# Patient Record
Sex: Female | Born: 1975 | Race: Black or African American | Hispanic: No | Smoking: Former smoker
Health system: Southern US, Community
[De-identification: ages and names within clinical notes are randomized; demographics above are authoritative.]

## PROBLEM LIST (undated history)

## (undated) DIAGNOSIS — R42 Dizziness and giddiness: Secondary | ICD-10-CM

## (undated) HISTORY — DX: Dizziness and giddiness: R42

---

## 2000-11-01 ENCOUNTER — Inpatient Hospital Stay (HOSPITAL_COMMUNITY): Admission: AD | Admit: 2000-11-01 | Discharge: 2000-11-01 | Payer: Self-pay | Admitting: Obstetrics

## 2000-11-12 ENCOUNTER — Encounter: Payer: Self-pay | Admitting: Emergency Medicine

## 2000-11-12 ENCOUNTER — Emergency Department (HOSPITAL_COMMUNITY): Admission: EM | Admit: 2000-11-12 | Discharge: 2000-11-12 | Payer: Self-pay | Admitting: Emergency Medicine

## 2001-02-22 ENCOUNTER — Emergency Department (HOSPITAL_COMMUNITY): Admission: EM | Admit: 2001-02-22 | Discharge: 2001-02-22 | Payer: Self-pay | Admitting: Emergency Medicine

## 2001-05-29 ENCOUNTER — Emergency Department (HOSPITAL_COMMUNITY): Admission: EM | Admit: 2001-05-29 | Discharge: 2001-05-29 | Payer: Self-pay | Admitting: Emergency Medicine

## 2002-12-16 ENCOUNTER — Encounter: Payer: Self-pay | Admitting: Emergency Medicine

## 2002-12-16 ENCOUNTER — Emergency Department (HOSPITAL_COMMUNITY): Admission: EM | Admit: 2002-12-16 | Discharge: 2002-12-16 | Payer: Self-pay | Admitting: Emergency Medicine

## 2005-02-21 ENCOUNTER — Emergency Department (HOSPITAL_COMMUNITY): Admission: EM | Admit: 2005-02-21 | Discharge: 2005-02-21 | Payer: Self-pay | Admitting: Emergency Medicine

## 2006-05-13 ENCOUNTER — Emergency Department (HOSPITAL_COMMUNITY): Admission: EM | Admit: 2006-05-13 | Discharge: 2006-05-13 | Payer: Self-pay | Admitting: Emergency Medicine

## 2006-07-19 ENCOUNTER — Emergency Department (HOSPITAL_COMMUNITY): Admission: EM | Admit: 2006-07-19 | Discharge: 2006-07-19 | Payer: Self-pay | Admitting: Emergency Medicine

## 2006-09-06 ENCOUNTER — Ambulatory Visit (HOSPITAL_COMMUNITY): Admission: RE | Admit: 2006-09-06 | Discharge: 2006-09-06 | Payer: Self-pay | Admitting: Family Medicine

## 2006-11-03 ENCOUNTER — Ambulatory Visit (HOSPITAL_COMMUNITY): Admission: RE | Admit: 2006-11-03 | Discharge: 2006-11-03 | Payer: Self-pay | Admitting: Obstetrics & Gynecology

## 2007-01-16 ENCOUNTER — Ambulatory Visit (HOSPITAL_COMMUNITY): Admission: RE | Admit: 2007-01-16 | Discharge: 2007-01-16 | Payer: Self-pay | Admitting: Family Medicine

## 2007-03-21 ENCOUNTER — Ambulatory Visit: Payer: Self-pay | Admitting: Obstetrics and Gynecology

## 2007-03-21 ENCOUNTER — Encounter (INDEPENDENT_AMBULATORY_CARE_PROVIDER_SITE_OTHER): Payer: Self-pay | Admitting: Specialist

## 2007-03-21 ENCOUNTER — Inpatient Hospital Stay (HOSPITAL_COMMUNITY): Admission: AD | Admit: 2007-03-21 | Discharge: 2007-03-24 | Payer: Self-pay | Admitting: Family Medicine

## 2007-11-10 ENCOUNTER — Emergency Department (HOSPITAL_COMMUNITY): Admission: EM | Admit: 2007-11-10 | Discharge: 2007-11-10 | Payer: Self-pay | Admitting: Emergency Medicine

## 2008-05-13 ENCOUNTER — Emergency Department (HOSPITAL_COMMUNITY): Admission: EM | Admit: 2008-05-13 | Discharge: 2008-05-13 | Payer: Self-pay | Admitting: Emergency Medicine

## 2009-05-28 ENCOUNTER — Emergency Department (HOSPITAL_COMMUNITY): Admission: EM | Admit: 2009-05-28 | Discharge: 2009-05-28 | Payer: Self-pay | Admitting: Emergency Medicine

## 2011-04-04 LAB — POCT PREGNANCY, URINE

## 2011-05-13 NOTE — Discharge Summary (Signed)
Paula Ray, Paula Ray                  ACCOUNT NO.:  [id number]   MEDICAL RECORD NO.:  [id number]          PATIENT TYPE:  INP   LOCATION:  9101                          FACILITY:  [facility]   PHYSICIAN:  Tracy L. Mayford Knife, M.D.DATE OF BIRTH:  1976-08-27   DATE OF ADMISSION:  03/21/2007  DATE OF DISCHARGE:  03/24/2007                               DISCHARGE SUMMARY   ADMISSION DIAGNOSIS:  A 39-week 1-day intrauterine pregnancy with  spontaneous rupture of membranes.   DISCHARGE DIAGNOSES:  1. Status post low transverse Cesarean section for cephalopelvic      disproportion, secondary to persistent posterior arrest.  2. Chorioamnionitis.  3. Anemia of pregnancy, aggravated by acute blood loss.   PERTINENT LABORATORY DATA:  Admission  hemoglobin 12.7. Operative day  #1, hemoglobin 10.5. Postoperative day #2, hemoglobin 9.7.   HOSPITAL COURSE:  The patient is a 35 year old G1 at 39 weeks 1 day who  presented following spontaneous rupture of membranes. She was dilated 1  to 2 cm at that time. The patient was noted to have elevated blood  pressures. She was transferred to labor and delivery where she got her  epidural, and her blood pressures improved. The patient progressed to  complete with Pitocin augmentation. She pushed for approximately 2.5  hours, never progressed past +2 station. The baby was in the direct  occiput posterior presentation. She was taken for cesarean section.  Please see cesarean section for full details. Viable female infant was  born. It is noted in the operative note that the baby's head was direct  occiput posterior and V flexed, so an assistant had to get under and  help push the head up to the point where a vacuum could be placed and  get the baby out. Apparently, the amniotic fluid also smelled foul, so  the patient was continued on antibiotics for 24 hours post surgery. The  patient remained afebrile during her hospitalization.   Other issue during this pregnancy  was anemia. Hemoglobin on admission  was 12.7. On postoperative day #2, it was 9.7. The patient was stable  and has no side effects from her hemoglobin being lower.   DISPOSITION:  [facility] she  will decide what she wants for birth control. [facility] nurse will  discontinue staples on postoperative day 5 to 7.   DISCHARGE MEDICATIONS:  1. Percocet 5 one to two p.o. q.4-6h. p.r.n.  2. Ibuprofen 600 mg p.o. q.8h.  3. Prenatal vitamins daily.   ACTIVITY:  Nothing per vagina for 6 weeks. No heavy lifting.   DIET:  Regular.           ______________________________  Marc Morgans. Mayford Knife, M.D.     TLW/MEDQ  D:  03/24/2007  T:  03/24/2007  Job:  [id number]

## 2011-05-13 NOTE — Op Note (Signed)
NAME:  Paula Ray, Paula Ray                  ACCOUNT NO.:  [id number]   MEDICAL RECORD NO.:  [id number]          PATIENT TYPE:  INP   LOCATION:  9101                          FACILITY:  [facility]   PHYSICIAN:  Phil D. Okey Dupre, M.D.     DATE OF BIRTH:  1976/01/16   DATE OF PROCEDURE:  03/21/2007  DATE OF DISCHARGE:                               OPERATIVE REPORT   PROCEDURE:  A low transverse cesarean section.   PREOPERATIVE DIAGNOSIS:  Cephalopelvic disproportion, persistent  posterior arrest.   POSTOPERATIVE DIAGNOSIS:  Cephalopelvic disproportion, persistent  posterior arrest.   SURGEON:  Dr. Okey Dupre   FIRST ASSISTANT:  Was my nurse Chip Boer.   ANESTHESIA:  Was epidural.   ESTIMATED BLOOD LOSS:  700 mL   SPECIMENS:  Placenta to pathology.   POSTOPERATIVE CONDITION:  Satisfactory   DESCRIPTION OF PROCEDURE:  Under satisfactory epidural anesthesia the  patient in dorsosupine position, Foley catheter in urinary bladder,  abdomen was prepped and draped in the usual sterile manner.  The abdomen  was entered through a transverse suprapubic incision situated just in  the crease under the abdominal apron and entered by layers. On entering  the peritoneal cavity visceral peritoneum and the anterior surface of  uterus opened transversely by sharp dissection after the ring retractor  had been placed into the peritoneal cavity for exposure. Bladder was  pushed away from the lower uterine segment was entered by sharp and  blunt dissection.  It was at this point that I found the baby's head was  stuck in a direct OP presentation, and deflexed in the pelvis. A hand to  be pushed back into the uterine incision as I attempted to dislodge the  baby's head. After several minutes unable to move it one bit, I had the  assistant go down and push it up from below, and I was then able to flex  the head enough to get the vacuum on the occiput and deliver the baby at  that point quite easily.  The baby had a terrible  odor at that time. The  cord was doubly clamped, divided. The baby handed pediatrician.  Specimens were taken from the cord for analysis.  The placenta was  manually removed and the uterus explored. The uterus was then closed in  two layers of continuous running locked 0 Vicryl on an atraumatic  needle. The angles were specifically examined to make sure there was no  extension laterally and no tears because of the excessive manipulation  at that time that I had to get the baby delivered.  Once this was done  the pelvis was irrigated and the fascia was closed continuous running 0  Vicryl on an atraumatic needle. Subcutaneous bleeders were controlled  with hot cautery and each layer was irrigated on exit.  The  skin edges approximated with skin staples.  Dry sterile dressings  applied.  The patient was transferred to recovery room in satisfactory  condition with the urine in the bag having a quite a slight heme  coloration but in the tube it was clear.  ______________________________  Javier Glazier Okey Dupre, M.D.     PDR/MEDQ  D:  03/21/2007  T:  03/21/2007  Job:  [id number]

## 2011-05-26 ENCOUNTER — Emergency Department (HOSPITAL_COMMUNITY): Payer: Self-pay

## 2011-05-26 ENCOUNTER — Emergency Department (HOSPITAL_COMMUNITY)
Admission: EM | Admit: 2011-05-26 | Discharge: 2011-05-26 | Disposition: A | Discharge status: Home / Self Care | Payer: Self-pay | Attending: Emergency Medicine | Admitting: Emergency Medicine

## 2011-05-26 DIAGNOSIS — J069 Acute upper respiratory infection, unspecified: Secondary | ICD-10-CM | POA: Insufficient documentation

## 2011-05-26 DIAGNOSIS — R0602 Shortness of breath: Secondary | ICD-10-CM | POA: Insufficient documentation

## 2011-05-26 DIAGNOSIS — J309 Allergic rhinitis, unspecified: Secondary | ICD-10-CM | POA: Insufficient documentation

## 2011-05-26 DIAGNOSIS — R05 Cough: Secondary | ICD-10-CM | POA: Insufficient documentation

## 2011-05-26 DIAGNOSIS — F17200 Nicotine dependence, unspecified, uncomplicated: Secondary | ICD-10-CM | POA: Insufficient documentation

## 2011-05-26 DIAGNOSIS — R059 Cough, unspecified: Secondary | ICD-10-CM | POA: Insufficient documentation

## 2011-05-26 LAB — RAPID STREP SCREEN (MED CTR MEBANE ONLY): Streptococcus, Group A Screen (Direct): NEGATIVE

## 2011-06-24 ENCOUNTER — Encounter (INDEPENDENT_AMBULATORY_CARE_PROVIDER_SITE_OTHER): Payer: Self-pay | Admitting: Physician Assistant

## 2011-06-24 DIAGNOSIS — Z803 Family history of malignant neoplasm of breast: Secondary | ICD-10-CM

## 2011-08-16 ENCOUNTER — Emergency Department (HOSPITAL_COMMUNITY)
Admission: EM | Admit: 2011-08-16 | Discharge: 2011-08-16 | Disposition: A | Discharge status: Home / Self Care | Payer: Self-pay | Attending: Emergency Medicine | Admitting: Emergency Medicine

## 2011-08-16 ENCOUNTER — Emergency Department (HOSPITAL_COMMUNITY): Payer: Self-pay

## 2011-08-16 DIAGNOSIS — R42 Dizziness and giddiness: Secondary | ICD-10-CM | POA: Insufficient documentation

## 2011-08-16 DIAGNOSIS — R110 Nausea: Secondary | ICD-10-CM | POA: Insufficient documentation

## 2011-08-16 DIAGNOSIS — R0789 Other chest pain: Secondary | ICD-10-CM | POA: Insufficient documentation

## 2011-08-16 DIAGNOSIS — F17200 Nicotine dependence, unspecified, uncomplicated: Secondary | ICD-10-CM | POA: Insufficient documentation

## 2011-08-16 LAB — POCT I-STAT, CHEM 8
Calcium, Ion: 1.21 mmol/L (ref 1.12–1.32)
HCT: 38 % (ref 36.0–46.0)
Hemoglobin: 12.9 g/dL (ref 12.0–15.0)
Sodium: 139 mEq/L (ref 135–145)
TCO2: 25 mmol/L (ref 0–100)

## 2011-08-16 LAB — POCT I-STAT TROPONIN I: Troponin i, poc: 0 ng/mL (ref 0.00–0.08)

## 2012-03-06 ENCOUNTER — Emergency Department (HOSPITAL_COMMUNITY)
Admission: EM | Admit: 2012-03-06 | Discharge: 2012-03-06 | Disposition: A | Discharge status: Home / Self Care | Payer: Self-pay | Attending: Emergency Medicine | Admitting: Emergency Medicine

## 2012-03-06 ENCOUNTER — Encounter (HOSPITAL_COMMUNITY): Payer: Self-pay | Admitting: *Deleted

## 2012-03-06 DIAGNOSIS — K5900 Constipation, unspecified: Secondary | ICD-10-CM | POA: Insufficient documentation

## 2012-03-06 DIAGNOSIS — F17200 Nicotine dependence, unspecified, uncomplicated: Secondary | ICD-10-CM | POA: Insufficient documentation

## 2012-03-06 MED ORDER — POLYETHYLENE GLYCOL 3350 17 GM/SCOOP PO POWD: ORAL | Status: DC

## 2012-03-06 MED ORDER — MAGNESIUM CITRATE PO SOLN
1.0000 | Freq: Once | ORAL | Status: DC
  Filled 2012-03-06: qty 296

## 2012-03-06 NOTE — Discharge Instructions (Signed)
Take miralax as directed in the next 6 hours if you do not have relief of constipation. You may also consider over the counter enema but return to ER for changing or worsening of symptoms.   Constipation in Adults Constipation is having fewer than 2 bowel movements per week. Usually, the stools are hard. As we grow older, constipation is more common. If you try to fix constipation with laxatives, the problem may get worse. This is because laxatives taken over a long period of time make the colon muscles weaker. A low-fiber diet, not taking in enough fluids, and taking some medicines may make these problems worse. MEDICATIONS THAT MAY CAUSE CONSTIPATION  Water pills (diuretics).   Calcium channel blockers (used to control blood pressure and for the heart).   Certain pain medicines (narcotics).   Anticholinergics.   Anti-inflammatory agents.   Antacids that contain aluminum.  DISEASES THAT CONTRIBUTE TO CONSTIPATION  Diabetes.   Parkinson's disease.   Dementia.   Stroke.   Depression.   Illnesses that cause problems with salt and water metabolism.  HOME CARE INSTRUCTIONS   Constipation is usually best cared for without medicines. Increasing dietary fiber and eating more fruits and vegetables is the best way to manage constipation.   Slowly increase fiber intake to 25 to 38 grams per day. Whole grains, fruits, vegetables, and legumes are good sources of fiber. A dietitian can further help you incorporate high-fiber foods into your diet.   Drink enough water and fluids to keep your urine clear or pale yellow.   A fiber supplement may be added to your diet if you cannot get enough fiber from foods.   Increasing your activities also helps improve regularity.   Suppositories, as suggested by your caregiver, will also help. If you are using antacids, such as aluminum or calcium containing products, it will be helpful to switch to products containing magnesium if your caregiver says  it is okay.   If you have been given a liquid injection (enema) today, this is only a temporary measure. It should not be relied on for treatment of longstanding (chronic) constipation.   Stronger measures, such as magnesium sulfate, should be avoided if possible. This may cause uncontrollable diarrhea. Using magnesium sulfate may not allow you time to make it to the bathroom.  SEEK IMMEDIATE MEDICAL CARE IF:   There is bright red blood in the stool.   The constipation stays for more than 4 days.   There is belly (abdominal) or rectal pain.   You do not seem to be getting better.   You have any questions or concerns.  MAKE SURE YOU:   Understand these instructions.   Will watch your condition.   Will get help right away if you are not doing well or get worse.  Document Released: 09/09/2004 Document Revised: 12/01/2011 Document Reviewed: 11/15/2011 [facility] Patient Information 2012 [place], [place].

## 2012-03-06 NOTE — ED Notes (Signed)
Patient reports she had a stomach virus a couple of weekends ago,  Then noted onset of constipation last week.  Patient reports she has not been able to pass gas for one day.  Patient reports her last bm was 4 or more days ago

## 2012-03-06 NOTE — ED Provider Notes (Signed)
Medical screening examination/treatment/procedure(s) were performed by non-physician practitioner and as supervising physician I was immediately available for consultation/collaboration. Devoria Albe, MD, Armando Gang   Ward Givens, MD 03/06/12 [phone]

## 2012-03-06 NOTE — ED Provider Notes (Signed)
History     CSN: [id number]  Arrival date & time 03/06/12  0841   First MD Initiated Contact with Patient 03/06/12 [phone]      Chief Complaint  Patient presents with  . Constipation  . Abdominal Pain    (Consider location/radiation/quality/duration/timing/severity/associated sxs/prior treatment) HPI  Patient presents to the emergency department complaining of a four-day history of constipation. Patient states that approximately a week and a half ago she had a few day history of nausea vomiting and diarrhea for which she took Pepto-Bismol and had relief of symptoms with in 2 days. Patient states she  felt well for a few days to a week and went back to eating her normal diet. Patient states that 4 days ago she had a small hard bowel movement and since then has had no other bowel movements. Patient states she has pressure in her lower abdominal region stating "I have the sensation that I need to have a bowel movement but I just can't." She denies injury to her back, difficulty urinating or saddle seat paresthesias. Patient states that she took a cap full of MiraLAX last night as well as half a tablet of a laxative but did not have a bowel movement and when she woke this morning with ongoing sensation to have a bowel movement and pressure in lower abdomen she presented to the ER for further evaluation. Patient states she has history of cesarean section but denies any other abdominal surgeries. Patient denies any current nausea or vomiting or any vomiting over the last week. Patient states she's been eating well over the last 4 days despite having constipation. She denies any point specific abdominal pain rather describes abdominal pressure or fullness. She denies fevers, chills, chest pain, shortness of breath, dysuria, hematuria or blood in her stool. Patient states last night she got a pair of latex gloves and felt in her rectum was able to pull out "small hard stool" but states she did not have a free  moving bowel movement after that.  History reviewed. No pertinent past medical history.  Past Surgical History  Procedure Date  . Cesarean section     No family history on file.  History  Substance Use Topics  . Smoking status: Current Everyday Smoker  . Smokeless tobacco: Not on file  . Alcohol Use: Yes    OB History    Grav Para Term Preterm Abortions TAB SAB Ect Mult Living                  Review of Systems  All other systems reviewed and are negative.    Allergies  Allegra and Zyrtec  Home Medications   Current Outpatient Rx  Name Route Sig Dispense Refill  . DOCUSATE SODIUM 100 MG PO CAPS Oral Take 100 mg by mouth at bedtime as needed. For constipation    . IBUPROFEN 200 MG PO TABS Oral Take 400 mg by mouth every 6 (six) hours as needed. For pain    . ADULT MULTIVITAMIN W/MINERALS CH Oral Take 1 tablet by mouth daily.    Marland Kitchen POLYETHYLENE GLYCOL 3350 PO PACK Oral Take 17 g by mouth daily as needed. For constipation      BP 117/73  Pulse 80  Temp(Src) 98.6 F (37 C) (Oral)  Resp 20  Ht 5\' 3"  (1.6 m)  Wt 240 lb (108.863 kg)  BMI 42.51 kg/m2  SpO2 98%  Physical Exam  Nursing note and vitals reviewed. Constitutional: She is oriented to person, place,  and time. She appears well-developed and well-nourished. No distress.  HENT:  Head: Normocephalic and atraumatic.  Eyes: Conjunctivae are normal.  Neck: Normal range of motion. Neck supple.  Cardiovascular: Normal rate, regular rhythm, normal heart sounds and intact distal pulses.  Exam reveals no gallop and no friction rub.   No murmur heard. Pulmonary/Chest: Effort normal and breath sounds normal. No respiratory distress. She has no wheezes. She has no rales. She exhibits no tenderness.  Abdominal: Soft. Bowel sounds are normal. She exhibits no distension and no mass. There is no tenderness. There is no rebound and no guarding.  Genitourinary:       Soft brown stool deep in vault without impaction. Normal  rectal tone.   Musculoskeletal: Normal range of motion. She exhibits no edema and no tenderness.  Neurological: She is alert and oriented to person, place, and time.  Skin: Skin is warm and dry. No rash noted. She is not diaphoretic. No erythema.  Psychiatric: She has a normal mood and affect.    ED Course  Procedures (including critical care time)  PO magnesium citrate  Labs Reviewed - No data to display No results found.   1. Constipation       MDM  Patient is afebrile with abdomen soft and non tender. No signs or symptoms of SBO with patient denying vomiting and no impaction on rectal exam. Spoke at length with patient about at home treatment of constipation and precautions that would warrant return to ER. Patient voices understanding and is agreeable to plan.         [place], [place] 03/06/12 1304

## 2012-04-10 ENCOUNTER — Encounter (HOSPITAL_COMMUNITY): Payer: Self-pay | Admitting: Emergency Medicine

## 2012-04-10 ENCOUNTER — Emergency Department (HOSPITAL_COMMUNITY)
Admission: EM | Admit: 2012-04-10 | Discharge: 2012-04-10 | Disposition: A | Discharge status: Home / Self Care | Payer: Self-pay | Attending: Emergency Medicine | Admitting: Emergency Medicine

## 2012-04-10 DIAGNOSIS — R509 Fever, unspecified: Secondary | ICD-10-CM | POA: Insufficient documentation

## 2012-04-10 DIAGNOSIS — R062 Wheezing: Secondary | ICD-10-CM | POA: Insufficient documentation

## 2012-04-10 DIAGNOSIS — J40 Bronchitis, not specified as acute or chronic: Secondary | ICD-10-CM | POA: Insufficient documentation

## 2012-04-10 MED ORDER — ALBUTEROL SULFATE (5 MG/ML) 0.5% IN NEBU
5.0000 mg | INHALATION_SOLUTION | Freq: Once | RESPIRATORY_TRACT | Status: AC
  Administered 2012-04-10: 5 mg via RESPIRATORY_TRACT
  Filled 2012-04-10: qty 0.5

## 2012-04-10 MED ORDER — ALBUTEROL SULFATE HFA 108 (90 BASE) MCG/ACT IN AERS
2.0000 | INHALATION_SPRAY | Freq: Four times a day (QID) | RESPIRATORY_TRACT | Status: DC
  Administered 2012-04-10: 2 via RESPIRATORY_TRACT
  Filled 2012-04-10: qty 6.7

## 2012-04-10 MED ORDER — PREDNISONE 10 MG PO TABS: 50.0000 mg | ORAL_TABLET | Freq: Every day | ORAL | Status: DC

## 2012-04-10 MED ORDER — IPRATROPIUM BROMIDE 0.02 % IN SOLN
0.5000 mg | RESPIRATORY_TRACT | Status: AC
  Administered 2012-04-10: 0.5 mg via RESPIRATORY_TRACT
  Filled 2012-04-10: qty 2.5

## 2012-04-10 MED ORDER — IBUPROFEN 800 MG PO TABS: 800.0000 mg | ORAL_TABLET | Freq: Three times a day (TID) | ORAL | Status: AC

## 2012-04-10 NOTE — ED Notes (Signed)
Pt c/o URI sx and fever starting yesterday with productive cough and body aches

## 2012-04-10 NOTE — ED Notes (Signed)
Pt denies any pain or questions upon discharge. 

## 2012-04-10 NOTE — ED Provider Notes (Signed)
History     CSN: [id number]  Arrival date & time 04/10/12  1713   First MD Initiated Contact with Patient 04/10/12 1902      Chief Complaint  Patient presents with  . URI  . Fever    (Consider location/radiation/quality/duration/timing/severity/associated sxs/prior treatment) HPI History from patient. 36 year old female with no pertinent past medical history who presents with URI symptoms which have been ongoing since yesterday. She states that she has had a cough which has been productive of yellow sputum. Denies any associated hemoptysis, leg swelling. Has had associated congestion, sinus pressure, and headache. She denies any dizziness, weakness, nausea, vomiting. She has had no chest pain, shortness of breath, diaphoresis. Denies abdominal pain, diarrhea. No treatment prior to arrival. Symptoms are constant in nature.  History reviewed. No pertinent past medical history.  Past Surgical History  Procedure Date  . Cesarean section     History reviewed. No pertinent family history.  History  Substance Use Topics  . Smoking status: Current Everyday Smoker  . Smokeless tobacco: Not on file  . Alcohol Use: Yes    OB History    Grav Para Term Preterm Abortions TAB SAB Ect Mult Living                  Review of Systems  Constitutional: Negative for fever, chills, activity change and appetite change.  HENT: Negative for neck pain and neck stiffness.   Cardiovascular: Negative for chest pain and palpitations.  Gastrointestinal: Negative for nausea, vomiting, abdominal pain and abdominal distention.    Allergies  Allegra and Zyrtec  Home Medications   Current Outpatient Rx  Name Route Sig Dispense Refill  . DOCUSATE SODIUM 100 MG PO CAPS Oral Take 100 mg by mouth at bedtime as needed. For constipation      BP 127/66  Pulse 96  Temp(Src) 98.8 F (37.1 C) (Oral)  Resp 20  SpO2 98%  Physical Exam  Nursing note and vitals reviewed. Constitutional: She appears  well-developed and well-nourished. No distress.  HENT:  Head: Normocephalic and atraumatic.  Right Ear: External ear normal.  Left Ear: External ear normal.  Mouth/Throat: Oropharynx is clear and moist. No oropharyngeal exudate.       Tympanic membranes normal bilaterally. No sinus tenderness to palpation  Eyes: EOM are normal. Pupils are equal, round, and reactive to light.  Neck: Normal range of motion.  Cardiovascular: Normal rate, regular rhythm and normal heart sounds.   Pulmonary/Chest: Effort normal. She exhibits no tenderness.       Expiratory wheezing throughout bilateral lung fields  Abdominal: Soft. There is no tenderness.  Musculoskeletal: Normal range of motion. She exhibits no edema.  Neurological: She is alert.  Skin: Skin is warm and dry. No rash noted. She is not diaphoretic.  Psychiatric: She has a normal mood and affect.    ED Course  Procedures (including critical care time)  Labs Reviewed - No data to display No results found.   1. Bronchitis       MDM  Patient presents with URI symptoms since yesterday. Vital signs stable and she is nontoxic appearing. Based on presentation, this appears to be infectious in nature. She was given an albuterol/Atrovent inhaler here, which vastly improved breath sounds. She obtained significant symptomatic relief with this. We'll plan to treat as likely bronchitis, with prednisone and albuterol. Return cost was discussed.        Grant Fontana, [place] 04/11/12 [phone]

## 2012-04-10 NOTE — Discharge Instructions (Signed)
Your wheezing indicates that you likely have bronchitis, which is an inflammation of the bronchioles in your lungs. You have been given a prescription for 3 days of prednisone and an albuterol inhaler to treat this. Use the albuterol every 4 hours while awake for the next 2 days then every 4 hours as needed. You may take Mucinex D to treat your congestion, and try nasal saline rinses for congestion as well. You can take 800 mg of ibuprofen 3 times a day as needed for muscle aches (take with food). Return to the ER with trouble breathing, chest pain, high fever not controlled by medication, or otherwise worsening condition.  RESOURCE GUIDE  Dental Problems  Patients with Medicaid: [facility] [phone] [address]                                           [phone] [address] Phone:  [phone]                                                  Phone:  [phone]  If unable to pay or uninsured, contact:  [facility] or [facility]. to become qualified for the adult dental clinic.  Chronic Pain Problems Contact [facility]  [phone] Patients need to be referred by their primary care doctor.  Insufficient Money for Medicine Contact [employer]:  call "211" or [facility] 601-125-6533.  No Primary Care Doctor Call [facility]  [phone] Other agencies that provide inexpensive medical care    [facility]  [phone]    [facility]  [phone]    [facility]  [phone]    [facility]  [phone]    [facility]  [phone]    [facility]  [phone]  Psychological Services [facility]  [phone] [facility]  [phone] [facility]   [phone] (emergency services [phone])  Substance Abuse Resources [facility]  [phone] [facility] [phone] [facility]  2140424024 Floydene Flock [phone] Residential & Outpatient Substance Abuse Program  [phone]  Abuse/Neglect [facility] Child Abuse Hotline (216)863-5991 [facility] Child Abuse Hotline 352-378-3640 (After Hours)  Emergency Shelter [facility] [phone]  M[facility] [place] [facility] [phone] [facility] [phone]  MRSA Hotline #:   (470)096-2873    [facility] Resources  [facility]     [employer]                          [facility] Dept. [address] [place]                       [address]      [address]  [address]  Cristobal Goldmann Phone:  [phone]                                   Phone:  [phone]                 Phone:  [phone]  [facility] Phone:  [phone]  [facility] Child Abuse Hotline 812-119-6896 873-338-9400 (After Hours)  Antibiotic Nonuse  Your caregiver felt that the infection or problem was not one that would be helped with an antibiotic. Infections may be caused by viruses or bacteria. Only a caregiver can tell which one of these is the likely cause of an illness. A cold is the most common cause of infection in both adults and children. A cold is a virus. Antibiotic treatment will have no effect on a viral infection. Viruses can lead to many lost days of work caring for sick children and many missed days of school. Children may catch as many as 10 "colds" or "flus" per year during which they can be tearful, cranky, and uncomfortable. The goal of treating a virus is aimed at keeping the ill person comfortable. Antibiotics are medications used to help the body fight bacterial infections. There are relatively few types of bacteria that cause infections but there are hundreds of viruses. While both viruses and bacteria cause  infection they are very different types of germs. A viral infection will typically go away by itself within 7 to 10 days. Bacterial infections may spread or get worse without antibiotic treatment. Examples of bacterial infections are:  Sore throats (like strep throat or tonsillitis).   Infection in the lung (pneumonia).   Ear and skin infections.  Examples of viral infections are:  Colds or flus.   Most coughs and bronchitis.   Sore throats not caused by Strep.   Runny noses.  It is often best not to take an antibiotic when a viral infection is the cause of the problem. Antibiotics can kill off the helpful bacteria that we have inside our body and allow harmful bacteria to start growing. Antibiotics can cause side effects such as allergies, nausea, and diarrhea without helping to improve the symptoms of the viral infection. Additionally, repeated uses of antibiotics can cause bacteria inside of our body to become resistant. That resistance can be passed onto harmful bacterial. The next time you have an infection it may be harder to treat if antibiotics are used when they are not needed. Not treating with antibiotics allows our own immune system to develop and take care of infections more efficiently. Also, antibiotics will work better for [place] when they are prescribed for bacterial infections. Treatments for a child that is ill may include:  Give extra fluids throughout the day to stay hydrated.   Get plenty of rest.   Only give your child over-the-counter or prescription medicines for pain, discomfort, or fever as directed by your caregiver.   The use of a cool mist humidifier may help stuffy noses.   Cold medications if suggested by your caregiver.  Your caregiver may decide to start you on an antibiotic if:  The problem you were seen for today continues for a longer length of time than expected.   You develop a secondary bacterial infection.  SEEK MEDICAL CARE IF:  Fever lasts  longer than 5 days.   Symptoms continue to get worse after 5 to 7 days or become severe.   Difficulty in breathing develops.   Signs of dehydration develop (poor drinking, rare urinating, dark colored urine).   Changes in behavior or worsening tiredness (listlessness or lethargy).  Document Released: 02/20/2002 Document Revised: 12/01/2011 Document Reviewed: 08/19/2009 [facility] Patient Information 2012 [place], [place].Bronchitis Bronchitis is the body's way of reacting to injury and/or infection (inflammation) of the bronchi. Bronchi are the air tubes that extend from the windpipe into the lungs. If the inflammation becomes severe, it may cause shortness of breath. CAUSES  Inflammation may be caused by:  A virus.   Germs (bacteria).   Dust.   Allergens.   Pollutants and many other irritants.  The cells lining the bronchial tree are covered with tiny hairs (cilia). These constantly beat upward, away from the lungs, toward the mouth. This keeps the lungs free of pollutants. When these cells become too irritated and are unable to do their job, mucus begins to develop. This causes the characteristic cough of bronchitis. The cough clears the lungs when the cilia are unable to do their job. Without either of these protective mechanisms, the mucus would settle in the lungs. Then you would develop pneumonia. Smoking is a common cause of bronchitis and can contribute to pneumonia. Stopping this habit is the single most important thing you can do to help yourself. TREATMENT   Your caregiver may prescribe an antibiotic if the cough is caused by bacteria. Also, medicines that open up your airways make it easier to breathe. Your caregiver may also recommend or prescribe an expectorant. It will loosen the mucus to be coughed up. Only take over-the-counter or prescription medicines for pain, discomfort, or fever as directed by your caregiver.   Removing whatever causes the problem (smoking, for  example) is critical to preventing the problem from getting worse.   Cough suppressants may be prescribed for relief of cough symptoms.   Inhaled medicines may be prescribed to help with symptoms now and to help prevent problems from returning.   For those with recurrent (chronic) bronchitis, there may be a need for steroid medicines.  SEEK IMMEDIATE MEDICAL CARE IF:   During treatment, you develop more pus-like mucus (purulent sputum).   You have a fever.   Your baby is older than 3 months with a rectal temperature of 102 F (38.9 C) or higher.   Your baby is 3 months old or younger with a rectal temperature of 100.4 F (38 C) or higher.   You become progressively more ill.   You have increased difficulty breathing, wheezing, or shortness of breath.  It is necessary to seek immediate medical care if you are elderly or sick from any other disease. MAKE SURE YOU:   Understand these instructions.   Will watch your condition.   Will get help right away if you are not doing well or get worse.  Document Released: 12/12/2005 Document Revised: 12/01/2011 Document Reviewed: 10/21/2008 [facility] Patient Information 2012 [place], [place].

## 2012-04-10 NOTE — ED Notes (Signed)
Respiratory called for breathing treatment.

## 2012-04-11 NOTE — ED Provider Notes (Signed)
Medical screening examination/treatment/procedure(s) were performed by non-physician practitioner and as supervising physician I was immediately available for consultation/collaboration.  Aston Lawhorn, MD 04/11/12 1651 

## 2012-10-10 ENCOUNTER — Encounter (HOSPITAL_COMMUNITY): Payer: Self-pay | Admitting: Family Medicine

## 2012-10-10 ENCOUNTER — Emergency Department (HOSPITAL_COMMUNITY)
Admission: EM | Admit: 2012-10-10 | Discharge: 2012-10-10 | Disposition: A | Discharge status: Home / Self Care | Payer: Self-pay | Attending: Emergency Medicine | Admitting: Emergency Medicine

## 2012-10-10 ENCOUNTER — Emergency Department (HOSPITAL_COMMUNITY): Payer: Self-pay

## 2012-10-10 DIAGNOSIS — M79609 Pain in unspecified limb: Secondary | ICD-10-CM

## 2012-10-10 DIAGNOSIS — M25469 Effusion, unspecified knee: Secondary | ICD-10-CM | POA: Insufficient documentation

## 2012-10-10 DIAGNOSIS — M25569 Pain in unspecified knee: Secondary | ICD-10-CM | POA: Insufficient documentation

## 2012-10-10 MED ORDER — OXYCODONE-ACETAMINOPHEN 5-325 MG PO TABS: ORAL_TABLET | ORAL | Status: DC

## 2012-10-10 NOTE — ED Provider Notes (Signed)
History     CSN: [id number]  Arrival date & time 10/10/12  1191   First MD Initiated Contact with Patient 10/10/12 1020      Chief Complaint  Patient presents with  . Knee Pain    (Consider location/radiation/quality/duration/timing/severity/associated sxs/prior treatment) The history is provided by the patient and medical records.    Paula Ray is a 36 y.o. female presents to the emergency department complaining of L knee swelling.  The onset of the symptoms was  gradual starting 1 day ago.  The patient has associated pain, paresthesias of the L foot, calf pain and swelling.  The symptoms have been  persistent, gradually worsened.  nothing makes the symptoms worse and nothing makes symptoms better.  The patient denies fall, known injury.  Pt states yesterday morning she began to develop swelling and pain in her L knee.  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  Pt denies Hx of DVT, long trips, no exogenous estrogen, or recent surgery. Pt states she is ambulatory with mild difficulty.     History reviewed. No pertinent past medical history.  Past Surgical History  Procedure Date  . Cesarean section     History reviewed. No pertinent family history.  History  Substance Use Topics  . Smoking status: Current Every Day Smoker  . Smokeless tobacco: Not on file  . Alcohol Use: Yes    OB History    Grav Para Term Preterm Abortions TAB SAB Ect Mult Living                  Review of Systems  Musculoskeletal: Positive for joint swelling, arthralgias and gait problem.       Calf pain  Skin: Negative for wound.  Neurological: Negative for numbness.    Allergies  Fexofenadine hcl and Zyrtec  Home Medications   Current Outpatient Rx  Name Route Sig Dispense Refill  . BENGAY EX Apply externally Apply 1 application topically daily as needed. For knee pain      BP 120/62  Pulse 85  Temp 98 F (36.7 C)  Resp 18   SpO2 98%  LMP 09/10/2012  Physical Exam  Nursing note and vitals reviewed. Constitutional: She appears well-developed and well-nourished. No distress.  HENT:  Head: Normocephalic and atraumatic.  Eyes: Conjunctivae normal are normal.  Cardiovascular: Normal rate, regular rhythm, normal heart sounds and intact distal pulses.  Exam reveals no gallop and no friction rub.   No murmur heard.      Capillary refill < 3 seconds  Pulmonary/Chest: Effort normal and breath sounds normal. No respiratory distress. She has no wheezes. She exhibits no tenderness.  Musculoskeletal: She exhibits tenderness ( L knee). She exhibits no edema.       ROM: Full ROM with pain; knee swelling and effusion present No increased warmth or erythema Mild calf swelling present; no pitting edema No significant calf tenderness; no palpable cord; negative homan's sign  Neurological: She is alert. Coordination normal.       Sensation intact to normal touch bilaterally Strength 5/5 in lower extremities bilaterally  Skin: Skin is warm and dry. No rash noted. She is not diaphoretic. No erythema.  Psychiatric: She has a normal mood and affect.    ED Course  Procedures (including critical care time)  Labs Reviewed - No data to display Dg Knee Complete 4 Views Left  10/10/2012  *RADIOLOGY REPORT*  Clinical Data: Knee pain  LEFT KNEE - COMPLETE 4+ VIEW  Comparison: None.  Findings: Negative for fracture.  Normal alignment and no degenerative changes or effusion.  No focal bony lesion.  IMPRESSION: Negative   Original Report Authenticated By: Camelia Phenes, M.D.      1. Knee pain   2. Knee effusion       MDM  Paula Ray presents with knee pain, knee swelling and L leg swelling.  Concern for DVT vs chondromalacia with knee effusion.  Pt with low risk Wells Criteria for DVT and PE.  PERC negative.  X-ray of L knee without bony abnormality.  Will evaluate for DVT with a LE Duplex scan.  I have discussed the patient  with Wynetta Emery PA-C and the patient will be moved to the CDU.    Plan: if LE Duplex negative pt may be d/c home with knee brace and Ortho follow-up.  I have discussed this with the patient and she is in agreement.          Dahlia Client Lakeishia Truluck, PA-C 10/10/12 1239

## 2012-10-10 NOTE — ED Provider Notes (Signed)
Paula Ray is a 36 y.o. female in CDU from pod A pending venous duplex of the lower extremities to rule out DVT. complaining left knee swelling and paresthesia with tenderness to palpation of left calf.   Patient resting comfortably in no acute distress or pain. Left knee shows trace swelling and tender to palpation of left calf with no size asymmetry bilaterally, no superficial collateral veins. Patient denies any shortness of breath and lung sounds are clear to auscultation bilaterally. Heart sounds are normal. Abdominal exam is benign.   Lotion may venous Doppler shows no DVT SVT muscle tears. Patient will be discharged with or so followup and pain control given.    Pt verbalized understanding and agrees with care plan. Outpatient follow-up and return precautions given.    New Prescriptions   OXYCODONE-ACETAMINOPHEN (PERCOCET/ROXICET) 5-325 MG PER TABLET    1 to 2 tabs PO q6hrs  PRN for pain    Wynetta Emery, PA-C 10/10/12 1424

## 2012-10-10 NOTE — ED Notes (Signed)
Pt c/o 6/10 pain at left knee since yesterday. Numbness sensation shoots down her left leg to left foot. Pt ambulatory, A&Ox4.

## 2012-10-10 NOTE — Progress Notes (Signed)
VASCULAR LAB PRELIMINARY  PRELIMINARY  PRELIMINARY  PRELIMINARY  Left lower extremity venous duplex completed.    Preliminary report: Left leg is negative for deep and superficial vein thrombosis.  No evidence of Baker's cyst or muscle tear. Ollie Esty, 10/10/2012, 12:06 PM

## 2012-10-10 NOTE — ED Provider Notes (Signed)
Medical screening examination/treatment/procedure(s) were performed by non-physician practitioner and as supervising physician I was immediately available for consultation/collaboration.  Ethelda Chick, MD 10/10/12 [phone]

## 2012-10-10 NOTE — ED Notes (Signed)
Pt sts left knee pain and swelling since yesterday. sts numbness in leg and foot.

## 2012-10-10 NOTE — ED Provider Notes (Signed)
Medical screening examination/treatment/procedure(s) were performed by non-physician practitioner and as supervising physician I was immediately available for consultation/collaboration.  Ethelda Chick, MD 10/10/12 [phone]

## 2014-11-26 ENCOUNTER — Emergency Department (HOSPITAL_COMMUNITY)
Admission: EM | Admit: 2014-11-26 | Discharge: 2014-11-26 | Disposition: A | Discharge status: Home / Self Care | Payer: Self-pay | Attending: Emergency Medicine | Admitting: Emergency Medicine

## 2014-11-26 ENCOUNTER — Encounter (HOSPITAL_COMMUNITY): Payer: Self-pay | Admitting: Emergency Medicine

## 2014-11-26 ENCOUNTER — Emergency Department (HOSPITAL_COMMUNITY): Payer: Self-pay

## 2014-11-26 DIAGNOSIS — R102 Pelvic and perineal pain: Secondary | ICD-10-CM | POA: Insufficient documentation

## 2014-11-26 DIAGNOSIS — Z79899 Other long term (current) drug therapy: Secondary | ICD-10-CM | POA: Insufficient documentation

## 2014-11-26 DIAGNOSIS — Z9889 Other specified postprocedural states: Secondary | ICD-10-CM | POA: Insufficient documentation

## 2014-11-26 DIAGNOSIS — Z720 Tobacco use: Secondary | ICD-10-CM | POA: Insufficient documentation

## 2014-11-26 DIAGNOSIS — N898 Other specified noninflammatory disorders of vagina: Secondary | ICD-10-CM | POA: Insufficient documentation

## 2014-11-26 DIAGNOSIS — Z3202 Encounter for pregnancy test, result negative: Secondary | ICD-10-CM | POA: Insufficient documentation

## 2014-11-26 LAB — CBC WITH DIFFERENTIAL/PLATELET
BASOS ABS: 0 10*3/uL (ref 0.0–0.1)
BASOS PCT: 0 % (ref 0–1)
EOS PCT: 2 % (ref 0–5)
Eosinophils Absolute: 0.1 10*3/uL (ref 0.0–0.7)
HCT: 37.8 % (ref 36.0–46.0)
Hemoglobin: 12.4 g/dL (ref 12.0–15.0)
LYMPHS PCT: 22 % (ref 12–46)
Lymphs Abs: 1.2 10*3/uL (ref 0.7–4.0)
MCH: 29.5 pg (ref 26.0–34.0)
MCHC: 32.8 g/dL (ref 30.0–36.0)
MCV: 90 fL (ref 78.0–100.0)
Monocytes Absolute: 0.3 10*3/uL (ref 0.1–1.0)
Monocytes Relative: 5 % (ref 3–12)
Neutro Abs: 3.9 10*3/uL (ref 1.7–7.7)
Neutrophils Relative %: 71 % (ref 43–77)
PLATELETS: 306 10*3/uL (ref 150–400)
RBC: 4.2 MIL/uL (ref 3.87–5.11)
RDW: 12.4 % (ref 11.5–15.5)
WBC: 5.6 10*3/uL (ref 4.0–10.5)

## 2014-11-26 LAB — URINALYSIS, ROUTINE W REFLEX MICROSCOPIC
BILIRUBIN URINE: NEGATIVE
Glucose, UA: NEGATIVE mg/dL
Hgb urine dipstick: NEGATIVE
Ketones, ur: NEGATIVE mg/dL
Leukocytes, UA: NEGATIVE
NITRITE: NEGATIVE
Protein, ur: NEGATIVE mg/dL
SPECIFIC GRAVITY, URINE: 1.026 (ref 1.005–1.030)
UROBILINOGEN UA: 1 mg/dL (ref 0.0–1.0)
pH: 5 (ref 5.0–8.0)

## 2014-11-26 LAB — WET PREP, GENITAL
Clue Cells Wet Prep HPF POC: NONE SEEN
Trich, Wet Prep: NONE SEEN
Yeast Wet Prep HPF POC: NONE SEEN

## 2014-11-26 LAB — RPR

## 2014-11-26 LAB — BASIC METABOLIC PANEL
ANION GAP: 13 (ref 5–15)
BUN: 9 mg/dL (ref 6–23)
CALCIUM: 9.5 mg/dL (ref 8.4–10.5)
CO2: 24 mEq/L (ref 19–32)
CREATININE: 0.86 mg/dL (ref 0.50–1.10)
Chloride: 103 mEq/L (ref 96–112)
GFR, EST NON AFRICAN AMERICAN: 86 mL/min — AB (ref >90)
Glucose, Bld: 91 mg/dL (ref 70–99)
Potassium: 4.1 mEq/L (ref 3.7–5.3)
SODIUM: 140 meq/L (ref 137–147)

## 2014-11-26 LAB — RAPID HIV SCREEN (WH-MAU): SUDS RAPID HIV SCREEN: NONREACTIVE

## 2014-11-26 LAB — POC URINE PREG, ED: PREG TEST UR: NEGATIVE

## 2014-11-26 MED ORDER — TRAMADOL HCL 50 MG PO TABS: 50.0000 mg | ORAL_TABLET | Freq: Four times a day (QID) | ORAL | Status: DC | PRN

## 2014-11-26 MED ORDER — IBUPROFEN 800 MG PO TABS
800.0000 mg | ORAL_TABLET | Freq: Once | ORAL | Status: AC
  Administered 2014-11-26: 800 mg via ORAL
  Filled 2014-11-26: qty 1

## 2014-11-26 NOTE — ED Notes (Signed)
Pt states that she is having pelvic pain x 1 day that "feels like menstrual cramps but it's not time for it yet".  Denies any discharge or bleeding but states that she has been urinating more frequently.

## 2014-11-26 NOTE — ED Notes (Signed)
Patient transported to Ultrasound 

## 2014-11-26 NOTE — ED Notes (Signed)
Per Marchelle FolksAmanda in lab, HIV results are NON-REACTIVE Will make assigned nurse aware

## 2014-11-26 NOTE — Discharge Instructions (Signed)
Take the prescribed medication as directed.  May take tylenol with this if needed. [facility] you have ongoing symptoms.  Would recommend an updated PAP smear. Return to the ED for new or worsening symptoms.

## 2014-11-26 NOTE — ED Provider Notes (Signed)
CSN: [id number]     Arrival date & time 11/26/14  [id number] History   First MD Initiated Contact with Patient 11/26/14 0915     Chief Complaint  Patient presents with  . Pelvic Pain     (Consider location/radiation/quality/duration/timing/severity/associated sxs/prior Treatment) Patient is a 38 y.o. female presenting with pelvic pain. The history is provided by the patient and medical records.  Pelvic Pain  This is a 38 year old female with no significant past medical history presenting to the ED for 1 day of lower pelvic pain. Patient states pain came on abruptly, localized to her suprapubic region, and feels like menstrual cramps. She states she recently had her menstrual cycle on 11/14/2014 which was a normal cycle for her. She denies any irregular bleeding or vaginal discharge. She does know she has been urinating more frequently but denies dysuria or hematuria. No new sexual partners. Patient does have history of chlamydia in 2004, no issues since this time.  Has been monogamous with current sexual partners for the past several months. She has not seen an OB/GYN in several years since she lost her insurance. She denies any abdominal pain, nausea, vomiting, diarrhea, fever, chills, or sweats.   History reviewed. No pertinent past medical history. Past Surgical History  Procedure Laterality Date  . Cesarean section     No family history on file. History  Substance Use Topics  . Smoking status: Current Every Day Smoker  . Smokeless tobacco: Not on file  . Alcohol Use: Yes   OB History    No data available     Review of Systems  Genitourinary: Positive for frequency and pelvic pain.  All other systems reviewed and are negative.     Allergies  Fexofenadine hcl and Zyrtec  Home Medications   Prior to Admission medications   Medication Sig Start Date End Date Taking? Authorizing Provider  Menthol, Topical Analgesic, (BENGAY EX) Apply 1 application topically daily as needed. For  knee pain    Historical Provider, MD  oxyCODONE-acetaminophen (PERCOCET/ROXICET) 5-325 MG per tablet 1 to 2 tabs PO q6hrs  PRN for pain 10/10/12   Joni ReiningNicole Pisciotta, PA-C   BP 128/86 mmHg  Pulse 77  Temp(Src) 98.2 F (36.8 C) (Oral)  Resp 16  SpO2 100%  LMP 11/14/2014 (Approximate)   Physical Exam  Constitutional: She is oriented to person, place, and time. She appears well-developed and well-nourished. No distress.  HENT:  Head: Normocephalic and atraumatic.  Mouth/Throat: Oropharynx is clear and moist.  Eyes: Conjunctivae and EOM are normal. Pupils are equal, round, and reactive to light.  Neck: Normal range of motion. Neck supple.  Cardiovascular: Normal rate, regular rhythm and normal heart sounds.   Pulmonary/Chest: Effort normal and breath sounds normal. No respiratory distress. She has no wheezes.  Abdominal: Soft. Bowel sounds are normal. There is no tenderness. There is no guarding, no CVA tenderness, no tenderness at McBurney's point and negative Murphy's sign.  Abdomen soft, nondistended, endorses pain of suprapubic region but no focal tenderness or peritoneal signs  Genitourinary: There is no lesion on the right labia. There is no lesion on the left labia. Right adnexum displays tenderness. No bleeding in the vagina. No foreign body around the vagina. Vaginal discharge (scant, white) found.  Normal female external genitalia without visible lesions, scant amount of white vaginal discharge present in vault, no bleeding or foreign body noted, mild right adnexal tenderness and cervical "irritation and pressure" without CMT, left adnexa WNL  Musculoskeletal: Normal range of motion.  Neurological: She is alert and oriented to person, place, and time.  Skin: Skin is warm and dry. She is not diaphoretic.  Psychiatric: She has a normal mood and affect.  Nursing note and vitals reviewed.   ED Course  Procedures (including critical care time) Labs Review Labs Reviewed  WET PREP,  GENITAL - Abnormal; Notable for the following:    WBC, Wet Prep HPF POC FEW (*)    All other components within normal limits  BASIC METABOLIC PANEL - Abnormal; Notable for the following:    GFR calc non Af Amer 86 (*)    All other components within normal limits  GC/CHLAMYDIA PROBE AMP  URINALYSIS, ROUTINE W REFLEX MICROSCOPIC  CBC WITH DIFFERENTIAL  RAPID HIV SCREEN ([facility])  RPR  POC URINE PREG, ED    Imaging Review [place] Transvaginal Non-ob  11/26/2014   CLINICAL DATA:  Midline pelvic pain for 1 day. LMP 11/15/2014. Initial encounter.  EXAM: TRANSABDOMINAL AND TRANSVAGINAL ULTRASOUND OF PELVIS  DOPPLER ULTRASOUND OF OVARIES  TECHNIQUE: Both transabdominal and transvaginal ultrasound examinations of the pelvis were performed. Transabdominal technique was performed for global imaging of the pelvis including uterus, ovaries, adnexal regions, and pelvic cul-de-sac.  It was necessary to proceed with endovaginal exam following the transabdominal exam to visualize the uterus, endometrium and ovaries due to a poor sonographic window on the transabdominal study. Color and duplex Doppler ultrasound was utilized to evaluate blood flow to the ovaries.  COMPARISON:  None.  FINDINGS: Uterus  Measurements: 8.6 x 5.2 x 6.0 cm. No fibroids or other mass visualized.  Endometrium  Thickness: 4.8 mm.  No focal abnormality visualized.  Right ovary  Measurements: 3.4 x 2.7 x 2.3 cm. Normal appearance/no adnexal mass. There is a small dominant follicle. Blood flow is present with color Doppler.  Left ovary  Measurements: 4.0 x 2.4 x 2.3 cm. Normal appearance/no adnexal mass. Blood flow is present with color Doppler.  Pulsed Doppler evaluation of both ovaries demonstrates normal low-resistance arterial and venous waveforms.  Other findings  No free fluid.  IMPRESSION: Normal examination.  No evidence of adnexal mass or ovarian torsion.   Electronically Signed   By: Roxy HorsemanBill  Veazey M.D.   On: 11/26/2014 12:35   [place] Pelvis  Complete  11/26/2014   CLINICAL DATA:  Midline pelvic pain for 1 day. LMP 11/15/2014. Initial encounter.  EXAM: TRANSABDOMINAL AND TRANSVAGINAL ULTRASOUND OF PELVIS  DOPPLER ULTRASOUND OF OVARIES  TECHNIQUE: Both transabdominal and transvaginal ultrasound examinations of the pelvis were performed. Transabdominal technique was performed for global imaging of the pelvis including uterus, ovaries, adnexal regions, and pelvic cul-de-sac.  It was necessary to proceed with endovaginal exam following the transabdominal exam to visualize the uterus, endometrium and ovaries due to a poor sonographic window on the transabdominal study. Color and duplex Doppler ultrasound was utilized to evaluate blood flow to the ovaries.  COMPARISON:  None.  FINDINGS: Uterus  Measurements: 8.6 x 5.2 x 6.0 cm. No fibroids or other mass visualized.  Endometrium  Thickness: 4.8 mm.  No focal abnormality visualized.  Right ovary  Measurements: 3.4 x 2.7 x 2.3 cm. Normal appearance/no adnexal mass. There is a small dominant follicle. Blood flow is present with color Doppler.  Left ovary  Measurements: 4.0 x 2.4 x 2.3 cm. Normal appearance/no adnexal mass. Blood flow is present with color Doppler.  Pulsed Doppler evaluation of both ovaries demonstrates normal low-resistance arterial and venous waveforms.  Other findings  No free fluid.  IMPRESSION: Normal examination.  No evidence of adnexal mass or ovarian torsion.   Electronically Signed   By: Roxy Horseman M.D.   On: 11/26/2014 12:35   [place] Art/ven Flow Abd Pelv Doppler  11/26/2014   CLINICAL DATA:  Midline pelvic pain for 1 day. LMP 11/15/2014. Initial encounter.  EXAM: TRANSABDOMINAL AND TRANSVAGINAL ULTRASOUND OF PELVIS  DOPPLER ULTRASOUND OF OVARIES  TECHNIQUE: Both transabdominal and transvaginal ultrasound examinations of the pelvis were performed. Transabdominal technique was performed for global imaging of the pelvis including uterus, ovaries, adnexal regions, and pelvic cul-de-sac.   It was necessary to proceed with endovaginal exam following the transabdominal exam to visualize the uterus, endometrium and ovaries due to a poor sonographic window on the transabdominal study. Color and duplex Doppler ultrasound was utilized to evaluate blood flow to the ovaries.  COMPARISON:  None.  FINDINGS: Uterus  Measurements: 8.6 x 5.2 x 6.0 cm. No fibroids or other mass visualized.  Endometrium  Thickness: 4.8 mm.  No focal abnormality visualized.  Right ovary  Measurements: 3.4 x 2.7 x 2.3 cm. Normal appearance/no adnexal mass. There is a small dominant follicle. Blood flow is present with color Doppler.  Left ovary  Measurements: 4.0 x 2.4 x 2.3 cm. Normal appearance/no adnexal mass. Blood flow is present with color Doppler.  Pulsed Doppler evaluation of both ovaries demonstrates normal low-resistance arterial and venous waveforms.  Other findings  No free fluid.  IMPRESSION: Normal examination.  No evidence of adnexal mass or ovarian torsion.   Electronically Signed   By: Roxy Horseman M.D.   On: 11/26/2014 12:35     EKG Interpretation None      MDM   Final diagnoses:  Pelvic pain in female   38 year old female with pelvic pain, onset yesterday. Patient describes pain as a menstrual cramp, it is not time for her current cycle. On exam, patient afebrile and nontoxic in appearance. She endorses cramping pain in her suprapubic region, however abdominal exam is benign. Pelvic exam performed, scant amount of vaginal discharge noted without bleeding or foreign body. She expresses some cervical irritation and pressure on exam, but there is no cervical motion tenderness. Mild amount of right adnexal tenderness without fullness or appreciable mass. Wet prep negative. GC chlamydia swab pending. Rapid HIV nonreactive. RPR pending. Patient will be sent for pelvic ultrasound.  Motrin given for pain at her request.  Pelvic u/s negative for acute findings-- no masses, TOA, or evidence of ovarian torsion.   After motrin patient states she is feeling better.  Unknown etiology of patient's pain at this time.  [facility] and continued monitoring of symptoms.  Rx tramadol for home if needed.  Discussed plan with patient, he/she acknowledged understanding and agreed with plan of care.  Return precautions given for new or worsening symptoms.  Garlon Hatchet, PA-C 11/26/14 1420  Samuel Jester, DO 11/26/14 [phone]

## 2014-11-26 NOTE — ED Notes (Signed)
AVS explained in detail. Patient ambulatory and denies pain at this time. Given [facility] information for follow[facility]. Knows not to drink/drive with Tramadol prescription. No other questions/concerns.

## 2014-11-27 LAB — GC/CHLAMYDIA PROBE AMP
CT PROBE, AMP APTIMA: NEGATIVE
GC PROBE AMP APTIMA: NEGATIVE

## 2015-03-03 ENCOUNTER — Encounter (HOSPITAL_COMMUNITY): Payer: Self-pay | Admitting: Emergency Medicine

## 2015-03-03 ENCOUNTER — Emergency Department (HOSPITAL_COMMUNITY)
Admission: EM | Admit: 2015-03-03 | Discharge: 2015-03-03 | Disposition: A | Discharge status: Home / Self Care | Payer: Self-pay | Attending: Emergency Medicine | Admitting: Emergency Medicine

## 2015-03-03 ENCOUNTER — Emergency Department (HOSPITAL_COMMUNITY): Payer: Self-pay

## 2015-03-03 DIAGNOSIS — Z79899 Other long term (current) drug therapy: Secondary | ICD-10-CM | POA: Insufficient documentation

## 2015-03-03 DIAGNOSIS — R0981 Nasal congestion: Secondary | ICD-10-CM

## 2015-03-03 DIAGNOSIS — H9203 Otalgia, bilateral: Secondary | ICD-10-CM | POA: Insufficient documentation

## 2015-03-03 DIAGNOSIS — R112 Nausea with vomiting, unspecified: Secondary | ICD-10-CM | POA: Insufficient documentation

## 2015-03-03 DIAGNOSIS — Z3202 Encounter for pregnancy test, result negative: Secondary | ICD-10-CM | POA: Insufficient documentation

## 2015-03-03 DIAGNOSIS — R519 Headache, unspecified: Secondary | ICD-10-CM

## 2015-03-03 DIAGNOSIS — R059 Cough, unspecified: Secondary | ICD-10-CM

## 2015-03-03 DIAGNOSIS — Z720 Tobacco use: Secondary | ICD-10-CM | POA: Insufficient documentation

## 2015-03-03 DIAGNOSIS — R05 Cough: Secondary | ICD-10-CM

## 2015-03-03 DIAGNOSIS — J069 Acute upper respiratory infection, unspecified: Secondary | ICD-10-CM | POA: Insufficient documentation

## 2015-03-03 DIAGNOSIS — R51 Headache: Secondary | ICD-10-CM | POA: Insufficient documentation

## 2015-03-03 DIAGNOSIS — J3489 Other specified disorders of nose and nasal sinuses: Secondary | ICD-10-CM

## 2015-03-03 LAB — POC URINE PREG, ED
Preg Test, Ur: NEGATIVE
Preg Test, Ur: NEGATIVE

## 2015-03-03 MED ORDER — ONDANSETRON 8 MG PO TBDP
8.0000 mg | ORAL_TABLET | Freq: Once | ORAL | Status: AC
  Administered 2015-03-03: 8 mg via ORAL
  Filled 2015-03-03: qty 1

## 2015-03-03 MED ORDER — KETOROLAC TROMETHAMINE 60 MG/2ML IM SOLN
60.0000 mg | Freq: Once | INTRAMUSCULAR | Status: AC
  Administered 2015-03-03: 60 mg via INTRAMUSCULAR
  Filled 2015-03-03: qty 2

## 2015-03-03 MED ORDER — FLUTICASONE PROPIONATE 50 MCG/ACT NA SUSP: 2.0000 | Freq: Every day | NASAL | Status: DC

## 2015-03-03 MED ORDER — ONDANSETRON HCL 8 MG PO TABS: 8.0000 mg | ORAL_TABLET | Freq: Three times a day (TID) | ORAL | Status: DC | PRN

## 2015-03-03 MED ORDER — NAPROXEN 500 MG PO TABS: 500.0000 mg | ORAL_TABLET | Freq: Two times a day (BID) | ORAL | Status: DC | PRN

## 2015-03-03 NOTE — ED Provider Notes (Signed)
CSN: [id number]     Arrival date & time 03/03/15  0756 History   First MD Initiated Contact with Patient 03/03/15 0840     Chief Complaint  Patient presents with  . Fever  . Cough  . Generalized Body Aches     (Consider location/radiation/quality/duration/timing/severity/associated sxs/prior Treatment) HPI Comments: Paula Ray is a 39 y.o. female with no significant PMHx, who presents to the ED with complaints of URI symptoms x3 days including sinus congestion with mild HA, clear rhinorrhea, cough with yellow sputum production, fever 101F on Sunday but now resolved, body aches, b/l ear pressure without drainage, wheezing, and nausea with 1 episode of posttussive emesis this morning. +smoker. +sick contacts. States she used [employer]ater[employer] at 8:30pm yesterday with some relief in symptoms, no meds taken since then and fever resolved spontaneously. States her HA is similar to prior headaches, and not the worst of her life. Denies ear drainage, eye symptoms, sore throat, CP, SOB, abd pain, diarrhea, constipation, hematuria, dysuria, vaginal bleeding/discharge, arthralgias, back/neck pain or stiffness, numbness, weakness, paresthesias, lightheadedness, vision changes, syncope, or rashes.   Patient is a 39 y.o. female presenting with fever and cough. The history is provided by the patient. No language interpreter was used.  Fever Max temp prior to arrival:  101 Temp source:  Oral Severity:  Mild Onset quality:  Gradual Duration:  2 days Timing:  Rare Progression:  Resolved Chronicity:  New Relieved by:  None tried Worsened by:  Nothing tried Ineffective treatments:  None tried Associated symptoms: congestion, cough, ear pain (b/l pressure), headaches (mild, sinus pressure related), myalgias (diffuse body aches), nausea, rhinorrhea and vomiting (x1 post-tussive)   Associated symptoms: no chest pain, no chills, no confusion, no diarrhea, no dysuria, no rash and no sore throat   Risk factors: sick  contacts   Risk factors: no recent travel   Cough Cough characteristics:  Productive Sputum characteristics:  Yellow Severity:  Mild Onset quality:  Gradual Duration:  3 days Timing:  Constant Progression:  Unchanged Chronicity:  New Smoker: yes   Context: sick contacts   Relieved by: alka seltzer. Worsened by:  Nothing tried Ineffective treatments:  None tried Associated symptoms: ear pain (b/l pressure), fever, headaches (mild, sinus pressure related), myalgias (diffuse body aches), rhinorrhea, sinus congestion and wheezing   Associated symptoms: no chest pain, no chills, no ear fullness, no eye discharge, no rash, no shortness of breath and no sore throat     History reviewed. No pertinent past medical history. Past Surgical History  Procedure Laterality Date  . Cesarean section     No family history on file. History  Substance Use Topics  . Smoking status: Current Every Day Smoker  . Smokeless tobacco: Not on file  . Alcohol Use: Yes   OB History    No data available     Review of Systems  Constitutional: Positive for fever. Negative for chills.  HENT: Positive for congestion, ear pain (b/l pressure), rhinorrhea and sinus pressure. Negative for ear discharge, sore throat and trouble swallowing.   Eyes: Negative for discharge and visual disturbance.  Respiratory: Positive for cough and wheezing. Negative for shortness of breath.   Cardiovascular: Negative for chest pain.  Gastrointestinal: Positive for nausea and vomiting (x1 post-tussive). Negative for abdominal pain, diarrhea and constipation.  Genitourinary: Negative for dysuria, hematuria, vaginal bleeding and vaginal discharge.  Musculoskeletal: Positive for myalgias (diffuse body aches). Negative for back pain, arthralgias, neck pain and neck stiffness.  Skin: Negative for color change  and rash.  Allergic/Immunologic: Negative for immunocompromised state.  Neurological: Positive for headaches (mild, sinus  pressure related). Negative for dizziness, syncope, weakness, light-headedness and numbness.  Psychiatric/Behavioral: Negative for confusion.   10 Systems reviewed and are negative for acute change except as noted in the HPI.    Allergies  Fexofenadine hcl and Zyrtec  Home Medications   Prior to Admission medications   Medication Sig Start Date End Date Taking? Authorizing Provider  Dextromethorphan HBr 15 MG/15ML LIQD Take 30 mLs by mouth every 6 (six) hours as needed (cold/flu).   Yes Historical Provider, MD  ibuprofen (ADVIL,MOTRIN) 200 MG tablet Take 400 mg by mouth every 6 (six) hours as needed for mild pain.   Yes Historical Provider, MD  Phenyleph-Doxylamine-DM-APAP 10-12.5-20-650 MG PACK Take 1 packet by mouth every 6 (six) hours as needed (cold/flu).   Yes Historical Provider, MD  oxyCODONE-acetaminophen (PERCOCET/ROXICET) 5-325 MG per tablet 1 to 2 tabs PO q6hrs  PRN for pain Patient not taking: Reported on 11/26/2014 10/10/12   Joni Reining Pisciotta, PA-C  traMADol (ULTRAM) 50 MG tablet Take 1 tablet (50 mg total) by mouth every 6 (six) hours as needed. Patient not taking: Reported on 03/03/2015 11/26/14   Garlon Hatchet, PA-C   BP 115/64 mmHg  Pulse 80  Temp(Src) 99.1 F (37.3 C) (Oral)  Resp 18  SpO2 96%  LMP 02/02/2015 Physical Exam  Constitutional: She is oriented to person, place, and time. Vital signs are normal. She appears well-developed and well-nourished.  Non-toxic appearance. No distress.  Afebrile, nontoxic, NAD, sleeping in bed  HENT:  Head: Normocephalic and atraumatic.  Right Ear: Hearing, tympanic membrane, external ear and ear canal normal.  Left Ear: Hearing, tympanic membrane, external ear and ear canal normal.  Nose: Mucosal edema and rhinorrhea present. Right sinus exhibits maxillary sinus tenderness and frontal sinus tenderness. Left sinus exhibits maxillary sinus tenderness and frontal sinus tenderness.  Mouth/Throat: Uvula is midline, oropharynx is clear  and moist and mucous membranes are normal. No trismus in the jaw. No uvula swelling.  Ears clear bilaterally B/l nasal turbinates with edema and clear rhinorrhea, diffuse sinus TTP Oropharynx clear and moist, no tonsillar swelling or exudates, no uvular deviation, no PTA  Eyes: Conjunctivae and EOM are normal. Right eye exhibits no discharge. Left eye exhibits no discharge.  Neck: Normal range of motion. Neck supple.  No meningismus  Cardiovascular: Normal rate, regular rhythm, normal heart sounds and intact distal pulses.  Exam reveals no gallop and no friction rub.   No murmur heard. Pulmonary/Chest: Effort normal and breath sounds normal. No respiratory distress. She has no decreased breath sounds. She has no wheezes. She has no rhonchi. She has no rales.  CTAB in all lung fields, no w/r/r, no hypoxia or increased WOB, speaking in full sentences, SpO2 96% on RA. Intermittent productive cough, no stridor or whooping noises  Abdominal: Soft. Normal appearance and bowel sounds are normal. She exhibits no distension. There is no tenderness. There is no rigidity, no rebound, no guarding, no tenderness at McBurney's point and negative Murphy's sign.  Musculoskeletal: Normal range of motion.  MAE x4 Strength and sensation grossly intact Distal pulses intact Gait steady  Lymphadenopathy:       Head (right side): No submandibular and no tonsillar adenopathy present.       Head (left side): No submandibular and no tonsillar adenopathy present.    She has cervical adenopathy.  Shotty anterior cervical LAD bilaterally  Neurological: She is alert and oriented  to person, place, and time. She has normal strength. No sensory deficit. Gait normal. GCS eye subscore is 4. GCS verbal subscore is 5. GCS motor subscore is 6.  Skin: Skin is warm, dry and intact. No rash noted.  Psychiatric: She has a normal mood and affect.  Nursing note and vitals reviewed.   ED Course  Procedures (including critical care  time) Labs Review Labs Reviewed  POC URINE PREG, ED  POC URINE PREG, ED    Imaging Review Dg Chest 2 View  03/03/2015   CLINICAL DATA:  39 year old female with cough congestion shortness of breath and mid chest pain for 3 days. Initial encounter. Current history of smoker.  EXAM: CHEST  2 VIEW  COMPARISON:  08/16/2011 and earlier.  FINDINGS: Lung volumes remain normal. Normal cardiac size and mediastinal contours. Visualized tracheal air column is within normal limits. No pneumothorax, pulmonary edema, pleural effusion or acute pulmonary opacity. No acute osseous abnormality identified.  IMPRESSION: No acute cardiopulmonary abnormality.   Electronically Signed   By: Odessa Fleming M.D.   On: 03/03/2015 10:42     EKG Interpretation None      MDM   Final diagnoses:  Cough  Viral URI  Sinus congestion  Sinus headache  Rhinorrhea    39 y.o. female here with URI type symptoms including sinus congestion with mild HA, rhinorrhea, cough with yellow sputum production, fever (now resolved), body aches, b/l ear pressure, subjective wheezing (smoker, no wheezing on exam), and nausea with 1 episode of posttussive emesis.  Exam reveals clear rhinorrhea, nasal turbinate edema, sinus tenderness, with clear lung exam, benign abd exam, and nonfocal neuro exam. +Sick contacts. Given that pt has productive cough with fever, will get CXR to r/o early PNA but lung exam clear therefore no nebs ordered. Will give toradol and zofran now for symptoms. Will send home with flonase, zofran, and naprosyn. Discussed use of netipot and mucinex. Discussed importance of rest and hydration. Will reassess after CXR.  11:47 AM CXR neg. Tolerating fluids. Delays with labs caused slight delays with discharge but pt feeling better. Will d/c home with previously discussed plan. Referral given to [facility]. I explained the diagnosis and have given explicit precautions to return to the ER including for any other new or  worsening symptoms. The patient understands and accepts the medical plan as it's been dictated and I have answered their questions. Discharge instructions concerning home care and prescriptions have been given. The patient is STABLE and is discharged to home in good condition.  BP 101/55 mmHg  Pulse 67  Temp(Src) 99.5 F (37.5 C) (Oral)  Resp 20  SpO2 96%  LMP 02/02/2015  Meds ordered this encounter  Medications  . ondansetron (ZOFRAN-ODT) disintegrating tablet 8 mg    Sig:   . ketorolac (TORADOL) injection 60 mg    Sig:   . fluticasone (FLONASE) 50 MCG/ACT nasal spray    Sig: Place 2 sprays into both nostrils daily.    Dispense:  16 g    Refill:  0    Order Specific Question:  Supervising Provider    Answer:  MILLER, BRIAN [[id number]]  . ondansetron (ZOFRAN) 8 MG tablet    Sig: Take 1 tablet (8 mg total) by mouth every 8 (eight) hours as needed for nausea or vomiting.    Dispense:  10 tablet    Refill:  0    Order Specific Question:  Supervising Provider    Answer:  Hyacinth Meeker, BRIAN [[id number]]  .  naproxen (NAPROSYN) 500 MG tablet    Sig: Take 1 tablet (500 mg total) by mouth 2 (two) times daily as needed for mild pain, moderate pain or headache (TAKE WITH MEALS.).    Dispense:  20 tablet    Refill:  0    Order Specific Question:  Supervising Provider    Answer:  Eusebio FriendlyMILLER, BRIAN [[id number]]     Brecklynn Jian Camprubi-Soms, PA-C 03/03/15 1148  Mancel BaleElliott Wentz, MD 03/04/15 1209

## 2015-03-03 NOTE — Discharge Instructions (Signed)
Continue to stay well-hydrated. Get plenty of rest. Continue to alternate between Tylenol and naprosyn for pain or fever. Use Mucinex for cough suppression/expectoration of mucus. Use netipot and flonase to help with nasal congestion. May consider over-the-counter Benadryl or other antihistamine to decrease secretions and for watery itchy eyes. Use zofran as needed for nausea. Followup with your primary care doctor in 5-7 days for recheck of ongoing symptoms. Return to emergency department for emergent changing or worsening of symptoms.   Upper Respiratory Infection, Adult An upper respiratory infection (URI) is also known as the common cold. It is often caused by a type of germ (virus). Colds are easily spread (contagious). You can pass it to others by kissing, coughing, sneezing, or drinking out of the same glass. Usually, you get better in 1 or 2 weeks.  HOME CARE   Only take medicine as told by your doctor.  Use a warm mist humidifier or breathe in steam from a hot shower.  Drink enough water and fluids to keep your pee (urine) clear or pale yellow.  Get plenty of rest.  Return to work when your temperature is back to normal or as told by your doctor. You may use a face mask and wash your hands to stop your cold from spreading. GET HELP RIGHT AWAY IF:   After the first few days, you feel you are getting worse.  You have questions about your medicine.  You have chills, shortness of breath, or brown or red spit (mucus).  You have yellow or brown snot (nasal discharge) or pain in the face, especially when you bend forward.  You have a fever, puffy (swollen) neck, pain when you swallow, or white spots in the back of your throat.  You have a bad headache, ear pain, sinus pain, or chest pain.  You have a high-pitched whistling sound when you breathe in and out (wheezing).  You have a lasting cough or cough up blood.  You have sore muscles or a stiff neck. MAKE SURE YOU:    Understand these instructions.  Will watch your condition.  Will get help right away if you are not doing well or get worse. Document Released: 05/30/2008 Document Revised: 03/05/2012 Document Reviewed: 03/19/2014 [facility] Patient Information 2015 [place], [place]. This information is not intended to replace advice given to you by your health care provider. Make sure you discuss any questions you have with your health care provider.  Sinus Headache A sinus headache is when your sinuses become clogged or swollen. Sinus headaches can range from mild to severe.  CAUSES A sinus headache can have different causes, such as:  Colds.  Sinus infections.  Allergies. SYMPTOMS  Symptoms of a sinus headache may vary and can include:  Headache.  Pain or pressure in the face.  Congested or runny nose.  Fever.  Inability to smell.  Pain in upper teeth. Weather changes can make symptoms worse. TREATMENT  The treatment of a sinus headache depends on the cause.  Sinus pain caused by a sinus infection may be treated with antibiotic medicine.  Sinus pain caused by allergies may be helped by allergy medicines (antihistamines) and medicated nasal sprays.  Sinus pain caused by congestion may be helped by flushing the nose and sinuses with saline solution. HOME CARE INSTRUCTIONS   If antibiotics are prescribed, take them as directed. Finish them even if you start to feel better.  Only take over-the-counter or prescription medicines for pain, discomfort, or fever as directed by your caregiver.  If you have congestion, use a nasal spray to help reduce pressure. SEEK IMMEDIATE MEDICAL CARE IF:  You have a fever.  You have headaches more than once a week.  You have sensitivity to light or sound.  You have repeated nausea and vomiting.  You have vision problems.  You have sudden, severe pain in your face or head.  You have a seizure.  You are confused.  Your sinus headaches do  not get better after treatment. Many people think they have a sinus headache when they actually have migraines or tension headaches. MAKE SURE YOU:   Understand these instructions.  Will watch your condition.  Will get help right away if you are not doing well or get worse. Document Released: 01/19/2005 Document Revised: 03/05/2012 Document Reviewed: 03/12/2011 [facility] Patient Information 2015 [place], [place]. This information is not intended to replace advice given to you by your health care provider. Make sure you discuss any questions you have with your health care provider.

## 2015-03-03 NOTE — ED Notes (Signed)
Pt tested negative for pregnancy @10 :15 on 03/03/2015

## 2015-03-03 NOTE — ED Notes (Signed)
Pt is requesting pregnancy test before any treatment.  Urine collected and poc u preg ordered

## 2015-03-03 NOTE — ED Notes (Signed)
Pt states that her daughter came home friday with fever and sick. Pt has been running fever, body aches and cough with phlegm over the past few days. Pt states that she took Alka seltzer last night but nothing today.

## 2015-03-05 ENCOUNTER — Telehealth (HOSPITAL_BASED_OUTPATIENT_CLINIC_OR_DEPARTMENT_OTHER): Payer: Self-pay | Admitting: Emergency Medicine

## 2015-03-07 ENCOUNTER — Emergency Department (HOSPITAL_COMMUNITY)
Admission: EM | Admit: 2015-03-07 | Discharge: 2015-03-07 | Disposition: A | Discharge status: Home / Self Care | Payer: Self-pay | Attending: Emergency Medicine | Admitting: Emergency Medicine

## 2015-03-07 ENCOUNTER — Encounter (HOSPITAL_COMMUNITY): Payer: Self-pay | Admitting: Emergency Medicine

## 2015-03-07 DIAGNOSIS — R42 Dizziness and giddiness: Secondary | ICD-10-CM | POA: Insufficient documentation

## 2015-03-07 DIAGNOSIS — Z7951 Long term (current) use of inhaled steroids: Secondary | ICD-10-CM | POA: Insufficient documentation

## 2015-03-07 DIAGNOSIS — J069 Acute upper respiratory infection, unspecified: Secondary | ICD-10-CM | POA: Insufficient documentation

## 2015-03-07 DIAGNOSIS — Z87891 Personal history of nicotine dependence: Secondary | ICD-10-CM | POA: Insufficient documentation

## 2015-03-07 DIAGNOSIS — Z79899 Other long term (current) drug therapy: Secondary | ICD-10-CM | POA: Insufficient documentation

## 2015-03-07 LAB — I-STAT CHEM 8, ED
BUN: 7 mg/dL (ref 6–23)
CHLORIDE: 106 mmol/L (ref 96–112)
Calcium, Ion: 1.12 mmol/L (ref 1.12–1.23)
Creatinine, Ser: 0.7 mg/dL (ref 0.50–1.10)
Glucose, Bld: 107 mg/dL — ABNORMAL HIGH (ref 70–99)
HCT: 37 % (ref 36.0–46.0)
Hemoglobin: 12.6 g/dL (ref 12.0–15.0)
POTASSIUM: 3.7 mmol/L (ref 3.5–5.1)
Sodium: 142 mmol/L (ref 135–145)
TCO2: 23 mmol/L (ref 0–100)

## 2015-03-07 MED ORDER — DEXAMETHASONE SODIUM PHOSPHATE 10 MG/ML IJ SOLN
10.0000 mg | Freq: Once | INTRAMUSCULAR | Status: AC
  Administered 2015-03-07: 10 mg via INTRAVENOUS
  Filled 2015-03-07: qty 1

## 2015-03-07 MED ORDER — DIAZEPAM 5 MG/ML IJ SOLN
2.5000 mg | Freq: Once | INTRAMUSCULAR | Status: AC
  Administered 2015-03-07: 2.5 mg via INTRAVENOUS
  Filled 2015-03-07: qty 2

## 2015-03-07 MED ORDER — DIAZEPAM 5 MG PO TABS: 5.0000 mg | ORAL_TABLET | Freq: Three times a day (TID) | ORAL | Status: DC | PRN

## 2015-03-07 MED ORDER — SODIUM CHLORIDE 0.9 % IV BOLUS (SEPSIS)
1000.0000 mL | Freq: Once | INTRAVENOUS | Status: AC
  Administered 2015-03-07: 1000 mL via INTRAVENOUS

## 2015-03-07 MED ORDER — DIAZEPAM 5 MG/ML IJ SOLN
2.5000 mg | Freq: Once | INTRAMUSCULAR | Status: AC
  Administered 2015-03-07: 2.5 mg via INTRAVENOUS

## 2015-03-07 MED ORDER — ONDANSETRON HCL 4 MG/2ML IJ SOLN
4.0000 mg | Freq: Once | INTRAMUSCULAR | Status: AC
  Administered 2015-03-07: 4 mg via INTRAVENOUS
  Filled 2015-03-07: qty 2

## 2015-03-07 NOTE — ED Provider Notes (Signed)
TIME SEEN: 7:30 AM  CHIEF COMPLAINT:  vertigo  HPI: Pt is a 39 y.o.  Female who presents the emergency department with complaints of vertigo worse with turning her head. She has had one week of nasal drainage, dry cough, sore throat, sinus headache, fever, ear pain. Reports that her headache, fever have improved. She states " usually I just given antibiotic and I feel better". She was seen in the hospital 4 days ago and was discharged with prescriptions for naproxen, Flonase, Zofran. States that she does not feel like these medications are helping her. Denies any ear pain or hearing loss. No ear drainage. No neck pain or neck stiffness. No vomiting or diarrhea. No rash. No known sick contacts or recent travel.   On 3 /8/16 patient had a negative chest x-ray and negative pregnancy test.  ROS: See HPI Constitutional:  fever  Eyes: no drainage  ENT:  runny nose   Cardiovascular:  no chest pain  Resp: no SOB  GI: no vomiting GU: no dysuria Integumentary: no rash  Allergy: no hives  Musculoskeletal: no leg swelling  Neurological: no slurred speech ROS otherwise negative  PAST MEDICAL HISTORY/PAST SURGICAL HISTORY:  History reviewed. No pertinent past medical history.  MEDICATIONS:  Prior to Admission medications   Medication Sig Start Date End Date Taking? Authorizing Provider  fluticasone (FLONASE) 50 MCG/ACT nasal spray Place 2 sprays into both nostrils daily. 03/03/15  Yes Mercedes Camprubi-Soms, PA-C  ibuprofen (ADVIL,MOTRIN) 200 MG tablet Take 400 mg by mouth every 6 (six) hours as needed for mild pain.   Yes Historical Provider, MD  naproxen (NAPROSYN) 500 MG tablet Take 1 tablet (500 mg total) by mouth 2 (two) times daily as needed for mild pain, moderate pain or headache (TAKE WITH MEALS.). 03/03/15  Yes Mercedes Camprubi-Soms, PA-C  ondansetron (ZOFRAN) 8 MG tablet Take 1 tablet (8 mg total) by mouth every 8 (eight) hours as needed for nausea or vomiting. 03/03/15  Yes Mercedes  Camprubi-Soms, PA-C  Dextromethorphan HBr 15 MG/15ML LIQD Take 30 mLs by mouth every 6 (six) hours as needed (cold/flu).    Historical Provider, MD  oxyCODONE-acetaminophen (PERCOCET/ROXICET) 5-325 MG per tablet 1 to 2 tabs PO q6hrs  PRN for pain Patient not taking: Reported on 11/26/2014 10/10/12   Joni Reining Pisciotta, PA-C  Phenyleph-Doxylamine-DM-APAP 10-12.5-20-650 MG PACK Take 1 packet by mouth every 6 (six) hours as needed (cold/flu).    Historical Provider, MD  traMADol (ULTRAM) 50 MG tablet Take 1 tablet (50 mg total) by mouth every 6 (six) hours as needed. Patient not taking: Reported on 03/03/2015 11/26/14   Garlon Hatchet, PA-C    ALLERGIES:  Allergies  Allergen Reactions  . Fexofenadine Hcl Hives and Rash    "allegra"  . Zyrtec [Cetirizine Hcl] Hives and Rash    SOCIAL HISTORY:  History  Substance Use Topics  . Smoking status: Former Smoker    Quit date: 02/28/2015  . Smokeless tobacco: Not on file  . Alcohol Use: Yes    FAMILY HISTORY: No family history on file.  EXAM: BP 132/83 mmHg  Pulse 70  Temp(Src) 98.3 F (36.8 C) (Oral)  Resp 17  Ht  (1.6 m)  Wt 240 lb (108.863 kg)  BMI 42.52 kg/m2  SpO2 100%  LMP 03/05/2015 CONSTITUTIONAL: Alert and oriented and responds appropriately to questions. Well-appearing; well-nourished HEAD: Normocephalic EYES: Conjunctivae clear, PERRL ENT: normal nose; no rhinorrhea; moist mucous membranes; pharynx without lesions noted , no tonsillar hypertrophy or exudate, no uvular  deviation, no trismus or drooling, normal phonation, no stridor, TMs are clear bilaterally, no signs of mastoiditis, mildly tender to palpation over her maxillary and frontal sinuses without erythema or warmth NECK: Supple, no meningismus, no LAD  CARD: RRR; S1 and S2 appreciated; no murmurs, no clicks, no rubs, no gallops RESP: Normal chest excursion without splinting or tachypnea; breath sounds clear and equal bilaterally; no wheezes, no rhonchi, no rales,   No hypoxia, speaking full sentences, no respiratory distress ABD/GI: Normal bowel sounds; non-distended; soft, non-tender, no rebound, no guarding BACK:  The back appears normal and is non-tender to palpation, there is no CVA tenderness EXT: Normal ROM in all joints; non-tender to palpation; no edema; normal capillary refill; no cyanosis    SKIN: Normal color for age and race; warm NEURO: Moves all extremities equally , sensation to light touch intact diffusely, cranial nerves II through XII intact, normal gait PSYCH: The patient's mood and manner are appropriate. Grooming and personal hygiene are appropriate.  MEDICAL DECISION MAKING:  Patient here with vertigo likely secondary to her viral upper respiratory symptoms. Have discussed with patient at length the reasons I do not feel she needs an antibiotic she is are the improving and has not had fever or headache for several days. Discussed with patient that treatment is supportive care.   We'll give IV fluids,  Valium, Zofran, Decadron and reassess. We'll check basic labs to ensure electrolytes, hemoglobin are not affected causing her dizziness.  ED PROGRESS:  Patient reports some improvement in vertigo but is still not feeling back to baseline. We'll give second dose of Valium. Her hemoglobin is 12.6, electrolytes normal.     Patient reports feeling better. Able to sit upright without vertigo. We'll discharge him with prescription for Valium. We'll also give ENT follow-up information if symptoms continue. Discussed return precautions. Discussed importance of increasing water intake. Patient verbalizes understanding and is comfortable with plan. Discussed again why do not feel she needs to be on antibiotics.     Layla MawKristen N Denina Rieger, DO 03/07/15 1017

## 2015-03-07 NOTE — Discharge Instructions (Signed)
Upper Respiratory Infection, Adult °An upper respiratory infection (URI) is also sometimes known as the common cold. The upper respiratory tract includes the nose, sinuses, throat, trachea, and bronchi. Bronchi are the airways leading to the lungs. Most people improve within 1 week, but symptoms can last up to 2 weeks. A residual cough may last even longer.  °CAUSES °Many different viruses can infect the tissues lining the upper respiratory tract. The tissues become irritated and inflamed and often become very moist. Mucus production is also common. A cold is contagious. You can easily spread the virus to others by oral contact. This includes kissing, sharing a glass, coughing, or sneezing. Touching your mouth or nose and then touching a surface, which is then touched by another person, can also spread the virus. °SYMPTOMS  °Symptoms typically develop 1 to 3 days after you come in contact with a cold virus. Symptoms vary from person to person. They may include: °· Runny nose. °· Sneezing. °· Nasal congestion. °· Sinus irritation. °· Sore throat. °· Loss of voice (laryngitis). °· Cough. °· Fatigue. °· Muscle aches. °· Loss of appetite. °· Headache. °· Low-grade fever. °DIAGNOSIS  °You might diagnose your own cold based on familiar symptoms, since most people get a cold 2 to 3 times a year. Your caregiver can confirm this based on your exam. Most importantly, your caregiver can check that your symptoms are not due to another disease such as strep throat, sinusitis, pneumonia, asthma, or epiglottitis. Blood tests, throat tests, and X-rays are not necessary to diagnose a common cold, but they may sometimes be helpful in excluding other more serious diseases. Your caregiver will decide if any further tests are required. °RISKS AND COMPLICATIONS  °You may be at risk for a more severe case of the common cold if you smoke cigarettes, have chronic heart disease (such as heart failure) or lung disease (such as asthma), or if  you have a weakened immune system. The very young and very old are also at risk for more serious infections. Bacterial sinusitis, middle ear infections, and bacterial pneumonia can complicate the common cold. The common cold can worsen asthma and chronic obstructive pulmonary disease (COPD). Sometimes, these complications can require emergency medical care and may be life-threatening. °PREVENTION  °The best way to protect against getting a cold is to practice good hygiene. Avoid oral or hand contact with people with cold symptoms. Wash your hands often if contact occurs. There is no clear evidence that vitamin C, vitamin E, echinacea, or exercise reduces the chance of developing a cold. However, it is always recommended to get plenty of rest and practice good nutrition. °TREATMENT  °Treatment is directed at relieving symptoms. There is no cure. Antibiotics are not effective, because the infection is caused by a virus, not by bacteria. Treatment may include: °· Increased fluid intake. Sports drinks offer valuable electrolytes, sugars, and fluids. °· Breathing heated mist or steam (vaporizer or shower). °· Eating chicken soup or other clear broths, and maintaining good nutrition. °· Getting plenty of rest. °· Using gargles or lozenges for comfort. °· Controlling fevers with ibuprofen or acetaminophen as directed by your caregiver. °· Increasing usage of your inhaler if you have asthma. °Zinc gel and zinc lozenges, taken in the first 24 hours of the common cold, can shorten the duration and lessen the severity of symptoms. Pain medicines may help with fever, muscle aches, and throat pain. A variety of non-prescription medicines are available to treat congestion and runny nose. Your caregiver   can make recommendations and may suggest nasal or lung inhalers for other symptoms.  HOME CARE INSTRUCTIONS   Only take over-the-counter or prescription medicines for pain, discomfort, or fever as directed by your  caregiver.  Use a warm mist humidifier or inhale steam from a shower to increase air moisture. This may keep secretions moist and make it easier to breathe.  Drink enough water and fluids to keep your urine clear or pale yellow.  Rest as needed.  Return to work when your temperature has returned to normal or as your caregiver advises. You may need to stay home longer to avoid infecting others. You can also use a face mask and careful hand washing to prevent spread of the virus. SEEK MEDICAL CARE IF:   After the first few days, you feel you are getting worse rather than better.  You need your caregiver's advice about medicines to control symptoms.  You develop chills, worsening shortness of breath, or brown or red sputum. These may be signs of pneumonia.  You develop yellow or brown nasal discharge or pain in the face, especially when you bend forward. These may be signs of sinusitis.  You develop a fever, swollen neck glands, pain with swallowing, or white areas in the back of your throat. These may be signs of strep throat. SEEK IMMEDIATE MEDICAL CARE IF:   You have a fever.  You develop severe or persistent headache, ear pain, sinus pain, or chest pain.  You develop wheezing, a prolonged cough, cough up blood, or have a change in your usual mucus (if you have chronic lung disease).  You develop sore muscles or a stiff neck. Document Released: 06/07/2001 Document Revised: 03/05/2012 Document Reviewed: 03/19/2014 [facility] Patient Information 2015 [place], [place]. This information is not intended to replace advice given to you by your health care provider. Make sure you discuss any questions you have with your health care provider.  Vertigo Vertigo means you feel like you or your surroundings are moving when they are not. Vertigo can be dangerous if it occurs when you are at work, driving, or performing difficult activities.  CAUSES  Vertigo occurs when there is a conflict of  signals sent to your brain from the visual and sensory systems in your body. There are many different causes of vertigo, including:  Infections, especially in the inner ear.  A bad reaction to a drug or misuse of alcohol and medicines.  Withdrawal from drugs or alcohol.  Rapidly changing positions, such as lying down or rolling over in bed.  A migraine headache.  Decreased blood flow to the brain.  Increased pressure in the brain from a head injury, infection, tumor, or bleeding. SYMPTOMS  You may feel as though the world is spinning around or you are falling to the ground. Because your balance is upset, vertigo can cause nausea and vomiting. You may have involuntary eye movements (nystagmus). DIAGNOSIS  Vertigo is usually diagnosed by physical exam. If the cause of your vertigo is unknown, your caregiver may perform imaging tests, such as an MRI scan (magnetic resonance imaging). TREATMENT  Most cases of vertigo resolve on their own, without treatment. Depending on the cause, your caregiver may prescribe certain medicines. If your vertigo is related to body position issues, your caregiver may recommend movements or procedures to correct the problem. In rare cases, if your vertigo is caused by certain inner ear problems, you may need surgery. HOME CARE INSTRUCTIONS   Follow your caregiver's instructions.  Avoid driving.  Avoid operating heavy machinery.  Avoid performing any tasks that would be dangerous to you or others during a vertigo episode.  Tell your caregiver if you notice that certain medicines seem to be causing your vertigo. Some of the medicines used to treat vertigo episodes can actually make them worse in some people. SEEK IMMEDIATE MEDICAL CARE IF:   Your medicines do not relieve your vertigo or are making it worse.  You develop problems with talking, walking, weakness, or using your arms, hands, or legs.  You develop severe headaches.  Your nausea or vomiting  continues or gets worse.  You develop visual changes.  A family member notices behavioral changes.  Your condition gets worse. MAKE SURE YOU:  Understand these instructions.  Will watch your condition.  Will get help right away if you are not doing well or get worse. Document Released: 09/21/2005 Document Revised: 03/05/2012 Document Reviewed: 06/30/2011 [facility] Patient Information 2015 [place], [place]. This information is not intended to replace advice given to you by your health care provider. Make sure you discuss any questions you have with your health care provider.

## 2015-03-07 NOTE — ED Notes (Addendum)
Pt presents with dizziness since leaving after being seen 03/03/15. Dizziness not present on last visit. +nasal drainage, +cough, +sore throat. No longer having fever, pt is having some "aching" in ears

## 2015-03-07 NOTE — ED Notes (Signed)
Pt tolerated po fluids and crackers, denies nausea at present time. Dizziness slightly improved.

## 2015-11-11 ENCOUNTER — Ambulatory Visit: Payer: Self-pay | Admitting: Family Medicine

## 2016-03-10 ENCOUNTER — Emergency Department (HOSPITAL_COMMUNITY)
Admission: EM | Admit: 2016-03-10 | Discharge: 2016-03-10 | Disposition: A | Discharge status: Home / Self Care | Payer: Self-pay | Attending: Emergency Medicine | Admitting: Emergency Medicine

## 2016-03-10 ENCOUNTER — Encounter (HOSPITAL_COMMUNITY): Payer: Self-pay

## 2016-03-10 DIAGNOSIS — H8309 Labyrinthitis, unspecified ear: Secondary | ICD-10-CM | POA: Insufficient documentation

## 2016-03-10 DIAGNOSIS — R05 Cough: Secondary | ICD-10-CM | POA: Insufficient documentation

## 2016-03-10 DIAGNOSIS — J302 Other seasonal allergic rhinitis: Secondary | ICD-10-CM | POA: Insufficient documentation

## 2016-03-10 DIAGNOSIS — Z7951 Long term (current) use of inhaled steroids: Secondary | ICD-10-CM | POA: Insufficient documentation

## 2016-03-10 DIAGNOSIS — F17200 Nicotine dependence, unspecified, uncomplicated: Secondary | ICD-10-CM | POA: Insufficient documentation

## 2016-03-10 DIAGNOSIS — H538 Other visual disturbances: Secondary | ICD-10-CM | POA: Insufficient documentation

## 2016-03-10 MED ORDER — ONDANSETRON 8 MG PO TBDP
8.0000 mg | ORAL_TABLET | Freq: Once | ORAL | Status: AC
  Administered 2016-03-10: 8 mg via ORAL
  Filled 2016-03-10: qty 1

## 2016-03-10 MED ORDER — MECLIZINE HCL 25 MG PO TABS
50.0000 mg | ORAL_TABLET | Freq: Once | ORAL | Status: AC
  Administered 2016-03-10: 50 mg via ORAL
  Filled 2016-03-10: qty 2

## 2016-03-10 MED ORDER — MECLIZINE HCL 25 MG PO TABS: 25.0000 mg | ORAL_TABLET | Freq: Three times a day (TID) | ORAL | Status: DC | PRN

## 2016-03-10 MED ORDER — LORAZEPAM 1 MG PO TABS: 1.0000 mg | ORAL_TABLET | Freq: Every day | ORAL | Status: DC

## 2016-03-10 MED ORDER — LORAZEPAM 1 MG PO TABS
1.0000 mg | ORAL_TABLET | Freq: Once | ORAL | Status: AC
  Administered 2016-03-10: 1 mg via ORAL
  Filled 2016-03-10: qty 1

## 2016-03-10 NOTE — ED Notes (Signed)
Pt presents with c/o dizziness. Pt reports she has a hx of vertigo. Pt reports the dizziness has been going on for a couple of weeks, progressively getting work. Pt reports she was more off balance today than usual.

## 2016-03-10 NOTE — Discharge Instructions (Signed)
Labyrinthitis Labyrinthitis is an infection of the inner ear. Your inner ear is a system of tubes and canals (labyrinth). These are filled with fluid. Nerve cells in your inner ear send signals for hearing and balance to your brain. When tiny germs get inside the tubes and canals, they harm the cells that send messages to the brain. This can cause changes in hearing and balance. HOME CARE INSTRUCTIONS  Take medicines only as told by your doctor.  If you were prescribed an antibiotic medicine, finish all of it even if you start to feel better.  Rest as much as possible.  Avoid loud noises and bright lights.  Do not make sudden movements until any dizziness goes away.  Do not drive until your doctor says that you can.  Drink enough fluid to keep your pee (urine) clear or pale yellow.  Work with a physical therapist if you still feel dizzy after several weeks. A therapist can teach you exercises to help you deal with your dizziness.  Keep all follow-up visits as told by your doctor. This is important. GET HELP IF:  Your symptoms do not get better with medicines.  You do not get better after two weeks.  You have a fever. GET HELP RIGHT AWAY IF:  You are very dizzy.  You keep throwing up (vomiting) or keep feeling sick to your stomach (nauseous).  Your hearing gets a lot worse very quickly.   This information is not intended to replace advice given to you by your health care provider. Make sure you discuss any questions you have with your health care provider.   Document Released: 12/12/2005 Document Revised: 01/02/2015 Document Reviewed: 09/23/2014 [employer] Interactive Patient Education [employer]  As discussed, continue using your OTC medicines for congestion and drink plenty of fluids.

## 2016-03-10 NOTE — ED Provider Notes (Signed)
CSN: [id number]     Arrival date & time 03/10/16  [id number] History   First MD Initiated Contact with Patient 03/10/16 [phone]     Chief Complaint  Patient presents with  . Dizziness   HPI Comments: 40 year old female presents with dizziness for the past week. She had a similar episode one year ago when she [facility] symptoms and dizziness. She reports her dizziness resolved within a week and she has not had any additional episodes up until now. She has been having "sinus problems" for the past month. Reports associated headache, congestion, rhinorrhea, seasonal allergies, cough, nausea. Denies fever, ear pain, hearing loss, tinnitus, sore throat, chest pain, SOB, vomiting.  The dizziness is present when sitting still. Worse with movement and lying down.   Patient is a 40 y.o. female presenting with dizziness.  Dizziness Associated symptoms: headaches and nausea   Associated symptoms: no chest pain, no hearing loss, no shortness of breath, no tinnitus, no vomiting and no weakness     History reviewed. No pertinent past medical history. Past Surgical History  Procedure Laterality Date  . Cesarean section     No family history on file. Social History  Substance Use Topics  . Smoking status: Current Every Day Smoker    Last Attempt to Quit: 02/28/2015  . Smokeless tobacco: None  . Alcohol Use: Yes     Comment: occasionally    OB History    No data available     Review of Systems  Constitutional: Negative for fever and chills.  HENT: Positive for congestion, rhinorrhea and sinus pressure. Negative for ear pain, hearing loss, sneezing, sore throat and tinnitus.   Eyes: Positive for visual disturbance.       Unable to focus on an object   Respiratory: Positive for cough. Negative for shortness of breath.   Cardiovascular: Negative for chest pain.  Gastrointestinal: Positive for nausea. Negative for vomiting.  Allergic/Immunologic: Positive for environmental allergies.   Neurological: Positive for dizziness and headaches. Negative for syncope, speech difficulty, weakness and numbness.    Allergies  Fexofenadine hcl and Zyrtec  Home Medications   Prior to Admission medications   Medication Sig Start Date End Date Taking? Authorizing Provider  Dextromethorphan HBr 15 MG/15ML LIQD Take 30 mLs by mouth every 6 (six) hours as needed (cold/flu).    Historical Provider, MD  diazepam (VALIUM) 5 MG tablet Take 1 tablet (5 mg total) by mouth every 8 (eight) hours as needed (vertigo). 03/07/15   Kristen N Ward, DO  fluticasone (FLONASE) 50 MCG/ACT nasal spray Place 2 sprays into both nostrils daily. 03/03/15   Mercedes Camprubi-Soms, PA-C  ibuprofen (ADVIL,MOTRIN) 200 MG tablet Take 400 mg by mouth every 6 (six) hours as needed for mild pain.    Historical Provider, MD  naproxen (NAPROSYN) 500 MG tablet Take 1 tablet (500 mg total) by mouth 2 (two) times daily as needed for mild pain, moderate pain or headache (TAKE WITH MEALS.). 03/03/15   Mercedes Camprubi-Soms, PA-C  ondansetron (ZOFRAN) 8 MG tablet Take 1 tablet (8 mg total) by mouth every 8 (eight) hours as needed for nausea or vomiting. 03/03/15   Mercedes Camprubi-Soms, PA-C  oxyCODONE-acetaminophen (PERCOCET/ROXICET) 5-325 MG per tablet 1 to 2 tabs PO q6hrs  PRN for pain Patient not taking: Reported on 11/26/2014 10/10/12   Joni ReiningNicole Pisciotta, PA-C  Phenyleph-Doxylamine-DM-APAP 10-12.5-20-650 MG PACK Take 1 packet by mouth every 6 (six) hours as needed (cold/flu).    Historical Provider, MD  traMADol (ULTRAM) 50 MG tablet Take 1 tablet (50 mg total) by mouth every 6 (six) hours as needed. Patient not taking: Reported on 03/03/2015 11/26/14   Garlon Hatchet, PA-C   BP 126/91 mmHg  Pulse 86  Temp(Src) 98 F (36.7 C) (Oral)  Resp 16  Wt 114.363 kg  SpO2 99%  LMP 02/28/2016 (Approximate)   Physical Exam  Constitutional: She is oriented to person, place, and time. She appears well-developed and well-nourished.  Tearful   HENT:  Head: Normocephalic and atraumatic.  Right Ear: Tympanic membrane, external ear and ear canal normal. No decreased hearing is noted.  Left Ear: Tympanic membrane, external ear and ear canal normal. No decreased hearing is noted.  Nose: Rhinorrhea present. Right sinus exhibits frontal sinus tenderness. Right sinus exhibits no maxillary sinus tenderness. Left sinus exhibits frontal sinus tenderness. Left sinus exhibits no maxillary sinus tenderness.  Mouth/Throat: Uvula is midline, oropharynx is clear and moist and mucous membranes are normal.  Nasal mucosa is erythematous  Eyes: Conjunctivae and EOM are normal. Pupils are equal, round, and reactive to light.  Cardiovascular: Normal rate and regular rhythm.  Exam reveals no gallop and no friction rub.   No murmur heard. Pulmonary/Chest: Effort normal. No respiratory distress. She has no wheezes. She has no rales.  Lymphadenopathy:    She has no cervical adenopathy.  Neurological: She is alert and oriented to person, place, and time.  Skin: Skin is warm and dry.  Psychiatric: She has a normal mood and affect.    ED Course  Procedures (including critical care time)   MDM   Final diagnoses:  Labyrinthitis, unspecified laterality   40 year old female presents with recurrent vertigo. She was symptom free for the past year and reports she only has vertigo when she is having URI symptoms or allergies. No focal neuro deficits. Symptoms were improved with Zofran, Ativan, and Meclizine. Advised to continue treating her congestion with OTC therapies and drink plenty of fluids. Patient informed of clinical course, understand medical decision-making process, and agrees with plan.    Bethel Born, PA-C 03/10/16 1317  Gerhard Munch, MD 03/11/16 [phone]

## 2016-07-15 ENCOUNTER — Encounter (HOSPITAL_COMMUNITY): Payer: Self-pay | Admitting: *Deleted

## 2016-07-15 ENCOUNTER — Emergency Department (HOSPITAL_COMMUNITY)
Admission: EM | Admit: 2016-07-15 | Discharge: 2016-07-15 | Disposition: A | Discharge status: Home / Self Care | Payer: Self-pay | Attending: Emergency Medicine | Admitting: Emergency Medicine

## 2016-07-15 ENCOUNTER — Emergency Department (HOSPITAL_COMMUNITY): Payer: Self-pay

## 2016-07-15 DIAGNOSIS — R059 Cough, unspecified: Secondary | ICD-10-CM

## 2016-07-15 DIAGNOSIS — N72 Inflammatory disease of cervix uteri: Secondary | ICD-10-CM

## 2016-07-15 DIAGNOSIS — F17200 Nicotine dependence, unspecified, uncomplicated: Secondary | ICD-10-CM | POA: Insufficient documentation

## 2016-07-15 DIAGNOSIS — R0602 Shortness of breath: Secondary | ICD-10-CM | POA: Insufficient documentation

## 2016-07-15 DIAGNOSIS — R42 Dizziness and giddiness: Secondary | ICD-10-CM | POA: Insufficient documentation

## 2016-07-15 DIAGNOSIS — R0789 Other chest pain: Secondary | ICD-10-CM | POA: Insufficient documentation

## 2016-07-15 DIAGNOSIS — N390 Urinary tract infection, site not specified: Secondary | ICD-10-CM | POA: Insufficient documentation

## 2016-07-15 DIAGNOSIS — R5383 Other fatigue: Secondary | ICD-10-CM | POA: Insufficient documentation

## 2016-07-15 DIAGNOSIS — R05 Cough: Secondary | ICD-10-CM

## 2016-07-15 DIAGNOSIS — R51 Headache: Secondary | ICD-10-CM | POA: Insufficient documentation

## 2016-07-15 LAB — POC URINE PREG, ED: Preg Test, Ur: NEGATIVE

## 2016-07-15 LAB — CBC WITH DIFFERENTIAL/PLATELET
BASOS ABS: 0 10*3/uL (ref 0.0–0.1)
BASOS PCT: 0 %
EOS ABS: 0.1 10*3/uL (ref 0.0–0.7)
Eosinophils Relative: 1 %
HEMATOCRIT: 40 % (ref 36.0–46.0)
HEMOGLOBIN: 13.2 g/dL (ref 12.0–15.0)
Lymphocytes Relative: 25 %
Lymphs Abs: 1.9 10*3/uL (ref 0.7–4.0)
MCH: 30.4 pg (ref 26.0–34.0)
MCHC: 33 g/dL (ref 30.0–36.0)
MCV: 92.2 fL (ref 78.0–100.0)
MONOS PCT: 7 %
Monocytes Absolute: 0.5 10*3/uL (ref 0.1–1.0)
NEUTROS ABS: 5.2 10*3/uL (ref 1.7–7.7)
NEUTROS PCT: 67 %
Platelets: 334 10*3/uL (ref 150–400)
RBC: 4.34 MIL/uL (ref 3.87–5.11)
RDW: 12.8 % (ref 11.5–15.5)
WBC: 7.8 10*3/uL (ref 4.0–10.5)

## 2016-07-15 LAB — COMPREHENSIVE METABOLIC PANEL
ALBUMIN: 4.1 g/dL (ref 3.5–5.0)
ALK PHOS: 65 U/L (ref 38–126)
ALT: 14 U/L (ref 14–54)
ANION GAP: 6 (ref 5–15)
AST: 18 U/L (ref 15–41)
BILIRUBIN TOTAL: 0.6 mg/dL (ref 0.3–1.2)
BUN: 11 mg/dL (ref 6–20)
CALCIUM: 8.9 mg/dL (ref 8.9–10.3)
CO2: 25 mmol/L (ref 22–32)
CREATININE: 0.89 mg/dL (ref 0.44–1.00)
Chloride: 108 mmol/L (ref 101–111)
GFR calc Af Amer: >60 mL/min (ref >60)
GFR calc non Af Amer: >60 mL/min (ref >60)
GLUCOSE: 113 mg/dL — AB (ref 65–99)
Potassium: 3.4 mmol/L — ABNORMAL LOW (ref 3.5–5.1)
SODIUM: 139 mmol/L (ref 135–145)
TOTAL PROTEIN: 7.3 g/dL (ref 6.5–8.1)

## 2016-07-15 LAB — URINALYSIS, ROUTINE W REFLEX MICROSCOPIC
Bilirubin Urine: NEGATIVE
GLUCOSE, UA: NEGATIVE mg/dL
KETONES UR: NEGATIVE mg/dL
Nitrite: NEGATIVE
PH: 6.5 (ref 5.0–8.0)
Protein, ur: NEGATIVE mg/dL
SPECIFIC GRAVITY, URINE: 1.023 (ref 1.005–1.030)

## 2016-07-15 LAB — URINE MICROSCOPIC-ADD ON

## 2016-07-15 LAB — WET PREP, GENITAL
Sperm: NONE SEEN
TRICH WET PREP: NONE SEEN
YEAST WET PREP: NONE SEEN

## 2016-07-15 LAB — D-DIMER, QUANTITATIVE (NOT AT ARMC)

## 2016-07-15 LAB — I-STAT TROPONIN, ED: Troponin i, poc: 0 ng/mL (ref 0.00–0.08)

## 2016-07-15 MED ORDER — CEPHALEXIN 500 MG PO CAPS: 500.0000 mg | ORAL_CAPSULE | Freq: Four times a day (QID) | ORAL | Status: DC

## 2016-07-15 MED ORDER — LIDOCAINE HCL 1 % IJ SOLN
INTRAMUSCULAR | Status: AC
  Administered 2016-07-15: 0.9 mL
  Filled 2016-07-15: qty 20

## 2016-07-15 MED ORDER — CEFTRIAXONE SODIUM 250 MG IJ SOLR
250.0000 mg | Freq: Once | INTRAMUSCULAR | Status: AC
  Administered 2016-07-15: 250 mg via INTRAMUSCULAR
  Filled 2016-07-15: qty 250

## 2016-07-15 MED ORDER — ALBUTEROL SULFATE HFA 108 (90 BASE) MCG/ACT IN AERS
2.0000 | INHALATION_SPRAY | Freq: Once | RESPIRATORY_TRACT | Status: AC
  Administered 2016-07-15: 2 via RESPIRATORY_TRACT
  Filled 2016-07-15: qty 6.7

## 2016-07-15 MED ORDER — ALBUTEROL SULFATE (2.5 MG/3ML) 0.083% IN NEBU
5.0000 mg | INHALATION_SOLUTION | Freq: Once | RESPIRATORY_TRACT | Status: AC
  Administered 2016-07-15: 5 mg via RESPIRATORY_TRACT
  Filled 2016-07-15: qty 6

## 2016-07-15 MED ORDER — AZITHROMYCIN 1 G PO PACK
1.0000 g | PACK | Freq: Once | ORAL | Status: AC
  Administered 2016-07-15: 1 g via ORAL
  Filled 2016-07-15: qty 1

## 2016-07-15 NOTE — ED Notes (Signed)
Pt states she felt shob last evening worse at work today, 2 days of ?vertigo, nasal congestion, now has some slight pain under rt breast

## 2016-07-15 NOTE — Discharge Instructions (Signed)
Take keflex as prescribed until all gone. You were treated today for possible STI, if your cultures come back positive you will be called. Inhaler every 4 hrs for cough. Drink plenty of fluids. Follow up with primary care doctor.   Urinary Tract Infection Urinary tract infections (UTIs) can develop anywhere along your urinary tract. Your urinary tract is your body's drainage system for removing wastes and extra water. Your urinary tract includes two kidneys, two ureters, a bladder, and a urethra. Your kidneys are a pair of bean-shaped organs. Each kidney is about the size of your fist. They are located below your ribs, one on each side of your spine. CAUSES Infections are caused by microbes, which are microscopic organisms, including fungi, viruses, and bacteria. These organisms are so small that they can only be seen through a microscope. Bacteria are the microbes that most commonly cause UTIs. SYMPTOMS  Symptoms of UTIs may vary by age and gender of the patient and by the location of the infection. Symptoms in young women typically include a frequent and intense urge to urinate and a painful, burning feeling in the bladder or urethra during urination. Older women and men are more likely to be tired, shaky, and weak and have muscle aches and abdominal pain. A fever may mean the infection is in your kidneys. Other symptoms of a kidney infection include pain in your back or sides below the ribs, nausea, and vomiting. DIAGNOSIS To diagnose a UTI, your caregiver will ask you about your symptoms. Your caregiver will also ask you to provide a urine sample. The urine sample will be tested for bacteria and white blood cells. White blood cells are made by your body to help fight infection. TREATMENT  Typically, UTIs can be treated with medication. Because most UTIs are caused by a bacterial infection, they usually can be treated with the use of antibiotics. The choice of antibiotic and length of treatment depend  on your symptoms and the type of bacteria causing your infection. HOME CARE INSTRUCTIONS  If you were prescribed antibiotics, take them exactly as your caregiver instructs you. Finish the medication even if you feel better after you have only taken some of the medication.  Drink enough water and fluids to keep your urine clear or pale yellow.  Avoid caffeine, tea, and carbonated beverages. They tend to irritate your bladder.  Empty your bladder often. Avoid holding urine for long periods of time.  Empty your bladder before and after sexual intercourse.  After a bowel movement, women should cleanse from front to back. Use each tissue only once. SEEK MEDICAL CARE IF:   You have back pain.  You develop a fever.  Your symptoms do not begin to resolve within 3 days. SEEK IMMEDIATE MEDICAL CARE IF:   You have severe back pain or lower abdominal pain.  You develop chills.  You have nausea or vomiting.  You have continued burning or discomfort with urination. MAKE SURE YOU:   Understand these instructions.  Will watch your condition.  Will get help right away if you are not doing well or get worse.   This information is not intended to replace advice given to you by your health care provider. Make sure you discuss any questions you have with your health care provider.   Document Released: 09/21/2005 Document Revised: 09/02/2015 Document Reviewed: 01/20/2012 Elsevier Interactive Patient Education 2016 Elsevier Inc.  Cervicitis Cervicitis is a soreness and swelling (inflammation) of the cervix. Your cervix is located at the bottom  of your uterus. It opens up to the vagina. CAUSES   Sexually transmitted infections (STIs).   Allergic reaction.   Medicines or birth control devices that are put in the vagina.   Injury to the cervix.   Bacterial infections.  RISK FACTORS You are at greater risk if you:  Have unprotected sexual intercourse.  Have sexual intercourse  with many partners.  Began sexual intercourse at an early age.  Have a history of STIs. SYMPTOMS  There may be no symptoms. If symptoms occur, they may include:   Gray, white, yellow, or bad-smelling vaginal discharge.   Pain or itching of the area outside the vagina.   Painful sexual intercourse.   Lower abdominal or lower back pain, especially during intercourse.   Frequent urination.   Abnormal vaginal bleeding between periods, after sexual intercourse, or after menopause.   Pressure or a heavy feeling in the pelvis.  DIAGNOSIS  Diagnosis is made after a pelvic exam. Other tests may include:   Examination of any discharge under a microscope (wet prep).   A Pap test.  TREATMENT  Treatment will depend on the cause of cervicitis. If it is caused by an STI, both you and your partner will need to be treated. Antibiotic medicines will be given.  HOME CARE INSTRUCTIONS   Do not have sexual intercourse until your health care provider says it is okay.   Do not have sexual intercourse until your partner has been treated, if your cervicitis is caused by an STI.   Take your antibiotics as directed. Finish them even if you start to feel better.  SEEK MEDICAL CARE IF:  Your symptoms come back.   You have a fever.  MAKE SURE YOU:   Understand these instructions.  Will watch your condition.  Will get help right away if you are not doing well or get worse.   This information is not intended to replace advice given to you by your health care provider. Make sure you discuss any questions you have with your health care provider.   Document Released: 12/12/2005 Document Revised: 12/17/2013 Document Reviewed: 06/05/2013 Elsevier Interactive Patient Education [employer] Elsevier Inc.

## 2016-07-15 NOTE — ED Notes (Signed)
Pelvic cart by the bedside.

## 2016-07-15 NOTE — ED Notes (Signed)
Bed: WA19 Expected date:  Expected time:  Means of arrival:  Comments: 

## 2016-07-15 NOTE — ED Notes (Signed)
Bed: WA20 Expected date:  Expected time:  Means of arrival:  Comments: 

## 2016-07-15 NOTE — ED Provider Notes (Signed)
CSN: [id number]     Arrival date & time 07/15/16  1051 History   First MD Initiated Contact with Patient 07/15/16 1127     Chief Complaint  Patient presents with  . Shortness of Breath     (Consider location/radiation/quality/duration/timing/severity/associated sxs/prior Treatment) HPI Paula Ray is a 40 y.o. female presents to emergency department complaining of shortness of breath, vertigo, weakness, "lethargy." Patient states all of her symptoms initially started 2 days ago, and states shortness of breath started today while at work. 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 She isn't every day smoker. She denies any prior lung or heart problems. Denies lower extremity swelling. Denies any pain in her calves. No recent travel or surgeries. Denies any fever or chills. She states that she has tried taking Ativan for her vertigo 2 days ago, did not take any yesterday or today. Stats has had some intermittent pain under right breast, none at this time. She states that she has had no energy and has been generally weak. States "I just do not feel like myself."  History reviewed. No pertinent past medical history. Past Surgical History  Procedure Laterality Date  . Cesarean section     No family history on file. Social History  Substance Use Topics  . Smoking status: Current Every Day Smoker    Last Attempt to Quit: 02/28/2015  . Smokeless tobacco: None  . Alcohol Use: Yes     Comment: occasionally    OB History    No data available     Review of Systems  Constitutional: Positive for fatigue. Negative for fever and chills.  HENT: Positive for congestion.   Respiratory: Positive for cough, chest tightness and shortness of breath.   Cardiovascular: Positive for chest pain.  Negative for palpitations and leg swelling.  Gastrointestinal: Negative for nausea, vomiting, abdominal pain and diarrhea.  Genitourinary: Positive for vaginal discharge. Negative for dysuria, flank pain, vaginal bleeding, vaginal pain and pelvic pain.  Musculoskeletal: Positive for myalgias and arthralgias. Negative for neck pain and neck stiffness.  Skin: Negative for rash.  Neurological: Positive for weakness and headaches. Negative for dizziness.  All other systems reviewed and are negative.     Allergies  Fexofenadine hcl and Zyrtec  Home Medications   Prior to Admission medications   Medication Sig Start Date End Date Taking? Authorizing Provider  LORazepam (ATIVAN) 1 MG tablet Take 1 tablet (1 mg total) by mouth at bedtime. Patient taking differently: Take 1 mg by mouth daily as needed (vertigo).  03/10/16  Yes Bethel Born, PA-C  meclizine (ANTIVERT) 25 MG tablet Take 1 tablet (25 mg total) by mouth 3 (three) times daily as needed for dizziness. Patient not taking: Reported on 07/15/2016 03/10/16   Bethel Born, PA-C   BP 136/93 mmHg  Pulse 89  Temp(Src) 98.3 F (36.8 C) (Oral)  Resp 16  Ht 5\' 3"  (1.6 m)  SpO2 100%  LMP 07/06/2016 Physical Exam  Constitutional: She is oriented to person, place, and time. She appears well-developed and well-nourished. No distress.  HENT:  Head: Normocephalic and atraumatic.  Right Ear: Tympanic membrane, external ear and ear canal normal.  Left Ear: Tympanic membrane and ear canal normal.  Nose: Mucosal edema present.  Mouth/Throat: Uvula is midline, oropharynx is clear  and moist and mucous membranes are normal. No posterior oropharyngeal edema or posterior oropharyngeal erythema.  Eyes: Conjunctivae are normal.  Neck: Normal range of motion. Neck supple.  Cardiovascular: Normal rate, regular rhythm and normal heart sounds.   Pulmonary/Chest: Effort normal and breath sounds normal. No respiratory distress. She has no wheezes.  She has no rales. She exhibits no tenderness.  Abdominal: Soft. Bowel sounds are normal. She exhibits no distension. There is no tenderness. There is no rebound.  Genitourinary:  Normal external genitalia. Normal vaginal canal. Moderate yellow discharge. Cervix is normal, closed. No CMT. No uterine or adnexal tenderness. No masses palpated.    Musculoskeletal: She exhibits no edema or tenderness.  Neurological: She is alert and oriented to person, place, and time.  Skin: Skin is warm and dry.  Psychiatric: She has a normal mood and affect. Her behavior is normal.  Nursing note and vitals reviewed.   ED Course  Procedures (including critical care time) Labs Review Labs Reviewed  WET PREP, GENITAL - Abnormal; Notable for the following:    Clue Cells Wet Prep HPF POC PRESENT (*)    WBC, Wet Prep HPF POC MANY (*)    All other components within normal limits  COMPREHENSIVE METABOLIC PANEL - Abnormal; Notable for the following:    Potassium 3.4 (*)    Glucose, Bld 113 (*)    All other components within normal limits  URINALYSIS, ROUTINE W REFLEX MICROSCOPIC (NOT AT [facility]) - Abnormal; Notable for the following:    Color, Urine AMBER (*)    APPearance CLOUDY (*)    Hgb urine dipstick TRACE (*)    Leukocytes, UA LARGE (*)    All other components within normal limits  URINE MICROSCOPIC-ADD ON - Abnormal; Notable for the following:    Squamous Epithelial / LPF 0-5 (*)    Bacteria, UA MANY (*)    All other components within normal limits  CBC WITH DIFFERENTIAL/PLATELET  D-DIMER, QUANTITATIVE (NOT AT [facility])  I-STAT TROPOININ, ED  POC URINE PREG, ED  GC/CHLAMYDIA PROBE AMP ([place]) NOT AT [facility]    Imaging Review No results found. I have personally reviewed and evaluated these images and lab results as part of my medical decision-making.   EKG Interpretation None      MDM   Final diagnoses:  Cough  Dizziness  UTI (lower urinary tract infection)  Cervicitis   Patient in  emergency department with multiple complaints. Sounds like patient's main complaint is shortness of breath, dizziness, fatigue. Symptoms onset 3 days ago, shortness of breath onset this morning at work. On exam, patient appears to be slightly anxious, her vital signs are all within normal. Her lungs are clear, patient just received a breathing treatment. Patient states "not feel jittery, did not want another treatment." We'll check chest x-ray, labs, urinalysis and pregnancy test.  Negative labs and chest x-ray. Patient feels better after breathing treatment. Her d-dimer is negative, doubt PE. Atypical symptoms for ACS. She has no risk factors. Patient appears to be anxious, discussed if she has been under a lot of stress and she said yes. Question whether this could be due to anxiety. Patient also said that she's been having some new yellow vaginal discharge since getting new partner. I perform her pelvic exam, it was unremarkable other than yellow discharge. She had no cervical motion tenderness, adnexal tenderness, uterine tenderness. I treated her anyways with 250 mg of Rocephin IM and Zithromax 1 g by mouth. Urinalysis showing infection, will discharge  with Keflex. Also provided her with inhalers since had symptomatic relief after a neb treatment. Vital signs are normal. Patient is stable. Will discharge home with outpatient follow-up.  Filed Vitals:   07/15/16 1116 07/15/16 1426 07/15/16 1550  BP: 136/93 115/88 124/83  Pulse: 89 72 86  Temp: 98.3 F (36.8 C)    TempSrc: Oral    Resp: Height:  (1.6 m)    SpO2: 100% 100% 100%     Jaynie Crumble, PA-C 07/15/16 1612  Lavera Guise, MD 07/15/16 [phone]

## 2016-07-18 LAB — GC/CHLAMYDIA PROBE AMP (~~LOC~~) NOT AT ARMC
CHLAMYDIA, DNA PROBE: NEGATIVE
Neisseria Gonorrhea: NEGATIVE

## 2016-12-23 ENCOUNTER — Ambulatory Visit: Payer: Self-pay | Admitting: Obstetrics & Gynecology

## 2017-05-16 ENCOUNTER — Emergency Department (HOSPITAL_COMMUNITY)
Admission: EM | Admit: 2017-05-16 | Discharge: 2017-05-16 | Disposition: A | Discharge status: Home / Self Care | Payer: Self-pay | Attending: Emergency Medicine | Admitting: Emergency Medicine

## 2017-05-16 ENCOUNTER — Encounter (HOSPITAL_COMMUNITY): Payer: Self-pay | Admitting: Emergency Medicine

## 2017-05-16 DIAGNOSIS — Z79899 Other long term (current) drug therapy: Secondary | ICD-10-CM | POA: Insufficient documentation

## 2017-05-16 DIAGNOSIS — L509 Urticaria, unspecified: Secondary | ICD-10-CM

## 2017-05-16 DIAGNOSIS — T7840XA Allergy, unspecified, initial encounter: Secondary | ICD-10-CM

## 2017-05-16 DIAGNOSIS — F17200 Nicotine dependence, unspecified, uncomplicated: Secondary | ICD-10-CM | POA: Insufficient documentation

## 2017-05-16 DIAGNOSIS — L500 Allergic urticaria: Secondary | ICD-10-CM | POA: Insufficient documentation

## 2017-05-16 MED ORDER — PREDNISONE 50 MG PO TABS: 50.0000 mg | ORAL_TABLET | Freq: Every day | ORAL | 0 refills | Status: AC

## 2017-05-16 MED ORDER — PREDNISONE 50 MG PO TABS
50.0000 mg | ORAL_TABLET | Freq: Once | ORAL | Status: AC
Start: 2017-05-16 — End: 2017-05-16
  Administered 2017-05-16: 50 mg via ORAL
  Filled 2017-05-16: qty 1

## 2017-05-16 NOTE — ED Provider Notes (Signed)
[facility]-EMERGENCY DEPT Provider Note   CSN: [id number] Arrival date & time: 05/16/17  4098   By signing my name below, I, Freida Busman, attest that this documentation has been prepared under the direction and in the presence of Audry Pili, PA-C. Electronically Signed: Freida Busman, Scribe. 05/16/2017. 11:10 AM.  History   Chief Complaint Chief Complaint  Patient presents with  . Allergic Reaction    The history is provided by the patient. No language interpreter was used.     HPI Comments:  Paula Ray is a 41 y.o. female who presents to the Emergency Department complaining of a pruritic rash to her bilateral forearms and bilateral thighs x 4 days. She notes the rash is intermittently present and seems to be worse at night. She denies recent changes in foods, meds, and soaps/detergents. She has taken benadryl and generic Claritin without relief. Pt also denies SOB. No difficulty swallowing or fever noted. She reports h/o similar episode in the past and was given prednisone with relief.   History reviewed. No pertinent past medical history.  There are no active problems to display for this patient.   Past Surgical History:  Procedure Laterality Date  . CESAREAN SECTION      OB History    No data available       Home Medications    Prior to Admission medications   Medication Sig Start Date End Date Taking? Authorizing Provider  cephALEXin (KEFLEX) 500 MG capsule Take 1 capsule (500 mg total) by mouth 4 (four) times daily. 07/15/16   Kirichenko, Tatyana, PA-C  LORazepam (ATIVAN) 1 MG tablet Take 1 tablet (1 mg total) by mouth at bedtime. Patient taking differently: Take 1 mg by mouth daily as needed (vertigo).  03/10/16   Bethel Born, PA-C  meclizine (ANTIVERT) 25 MG tablet Take 1 tablet (25 mg total) by mouth 3 (three) times daily as needed for dizziness. Patient not taking: Reported on 07/15/2016 03/10/16   Bethel Born, PA-C    Family History History  reviewed. No pertinent family history.  Social History Social History  Substance Use Topics  . Smoking status: Current Every Day Smoker    Last attempt to quit: 02/28/2015  . Smokeless tobacco: Not on file  . Alcohol use Yes     Comment: occasionally      Allergies   Fexofenadine hcl and Zyrtec [cetirizine hcl]   Review of Systems Review of Systems  HENT: Negative for trouble swallowing and voice change.   Respiratory: Negative for shortness of breath.   Skin: Positive for rash.     Physical Exam Updated Vital Signs BP 128/77 (BP Location: Right Arm)   Pulse 79   Temp 98 F (36.7 C) (Oral)   Resp 17   SpO2 98%   Physical Exam  Constitutional: She is oriented to person, place, and time. Vital signs are normal. She appears well-developed and well-nourished. No distress.  HENT:  Head: Normocephalic and atraumatic.  Right Ear: Hearing normal.  Left Ear: Hearing normal.  Eyes: Conjunctivae and EOM are normal. Pupils are equal, round, and reactive to light.  Cardiovascular: Normal rate and regular rhythm.   Pulmonary/Chest: Effort normal.  Abdominal: She exhibits no distension.  Neurological: She is alert and oriented to person, place, and time.  Skin: Skin is warm and dry. Rash noted.  Diffuse urticarial rash to bilateral forearms and right anterior thigh. No signs of infection; no purulence Non-tender to palpation  Psychiatric: She has a normal mood and affect.  Her speech is normal and behavior is normal. Thought content normal.  Nursing note and vitals reviewed.  ED Treatments / Results  DIAGNOSTIC STUDIES:  Oxygen Saturation is 98% on RA, normal by my interpretation.    COORDINATION OF CARE:  11:10 AM Discussed treatment plan with pt at bedside and pt agreed to plan.  Labs (all labs ordered are listed, but only abnormal results are displayed) Labs Reviewed - No data to display  EKG  EKG Interpretation None       Radiology No results  found.  Procedures Procedures (including critical care time)  Medications Ordered in ED Medications - No data to display   Initial Impression / Assessment and Plan / ED Course  I have reviewed the triage vital signs and the nursing notes.  Pertinent labs & imaging results that were available during my care of the patient were reviewed by me and considered in my medical decision making (see chart for details).     {I have reviewed the relevant previous healthcare records.  {I obtained HPI from historian.   ED Course:  Assessment: Patient with urticarial eruption. No specific trigger. Discussed that urticaria can be trigger by stress heat cold and for unknown reasons. No signs of anaphylactic reaction; no new medications. Will treat with prednisone burst. Continue Benadryl/Claritin. Follow up with PCP in 2-3 days. Return precautions discussed. Pt is safe for discharge at this time.     Disposition/Plan:  DC Home Additional Verbal discharge instructions given and discussed with patient.  Pt Instructed to f/u with PCP in the next week for evaluation and treatment of symptoms. Return precautions given Pt acknowledges and agrees with plan  Supervising Physician Bethann BerkshireZammit, Joseph, MD  Final Clinical Impressions(s) / ED Diagnoses   Final diagnoses:  Allergic reaction, initial encounter  Urticarial rash    New Prescriptions New Prescriptions   No medications on file   I personally performed the services described in this documentation, which was scribed in my presence. The recorded information has been reviewed and is accurate.    Audry PiliMohr, Neka Bise, PA-C 05/16/17 1131    Bethann BerkshireZammit, Joseph, MD 05/17/17 [phone]

## 2017-05-16 NOTE — ED Triage Notes (Signed)
Pt c/o pruritic, red, hives accompanied by chills onset Friday. No new soaps, detergents. No SOB or oral swelling. Self-treated with Benadryl with minimal relief.  Pt reports being bit by beg bugs on 04/15/17.

## 2017-05-16 NOTE — ED Notes (Signed)
Bed: WTR6 Expected date:  Expected time:  Means of arrival:  Comments: 

## 2017-05-16 NOTE — Discharge Instructions (Signed)
Please read and follow all provided instructions.  Your diagnoses today include:  1. Allergic reaction, initial encounter   2. Urticarial rash    Tests performed today include: Vital signs. See below for your results today.   Medications prescribed:  Take as prescribed   Home care instructions:  Follow any educational materials contained in this packet.  Follow-up instructions: Please follow-up with your primary care provider for further evaluation of symptoms and treatment   Return instructions:  Please return to the Emergency Department if you do not get better, if you get worse, or new symptoms OR  - Fever (temperature greater than 101.67F)  - Bleeding that does not stop with holding pressure to the area    -Severe pain (please note that you may be more sore the day after your accident)  - Chest Pain  - Difficulty breathing  - Severe nausea or vomiting  - Inability to tolerate food and liquids  - Passing out  - Skin becoming red around your wounds  - Change in mental status (confusion or lethargy)  - New numbness or weakness    Please return if you have any other emergent concerns.  Additional Information:  Your vital signs today were: BP 128/77 (BP Location: Right Arm)    Pulse 79    Temp 98 F (36.7 C) (Oral)    Resp 17    SpO2 98%  If your blood pressure (BP) was elevated above 135/85 this visit, please have this repeated by your doctor within one month. ---------------

## 2018-06-17 ENCOUNTER — Encounter (HOSPITAL_COMMUNITY): Payer: Self-pay | Admitting: Emergency Medicine

## 2018-06-17 ENCOUNTER — Other Ambulatory Visit: Payer: Self-pay

## 2018-06-17 ENCOUNTER — Emergency Department (HOSPITAL_COMMUNITY)
Admission: EM | Admit: 2018-06-17 | Discharge: 2018-06-17 | Disposition: A | Discharge status: Home / Self Care | Payer: Self-pay | Attending: Emergency Medicine | Admitting: Emergency Medicine

## 2018-06-17 DIAGNOSIS — Z79899 Other long term (current) drug therapy: Secondary | ICD-10-CM | POA: Insufficient documentation

## 2018-06-17 DIAGNOSIS — T7840XA Allergy, unspecified, initial encounter: Secondary | ICD-10-CM | POA: Insufficient documentation

## 2018-06-17 DIAGNOSIS — F17210 Nicotine dependence, cigarettes, uncomplicated: Secondary | ICD-10-CM | POA: Insufficient documentation

## 2018-06-17 MED ORDER — PREDNISONE 10 MG (21) PO TBPK: ORAL_TABLET | Freq: Every day | ORAL | 0 refills | Status: DC

## 2018-06-17 MED ORDER — PREDNISONE 20 MG PO TABS
60.0000 mg | ORAL_TABLET | Freq: Once | ORAL | Status: AC
  Administered 2018-06-17: 60 mg via ORAL
  Filled 2018-06-17: qty 3

## 2018-06-17 NOTE — ED Provider Notes (Signed)
[facility]-EMERGENCY DEPT Provider Note   CSN: [id number] Arrival date & time: 06/17/18  1442     History   Chief Complaint Chief Complaint  Patient presents with  . Rash  . Allergic Reaction    HPI Paula Ray is a 42 y.o. female.  HPI   Paula Ray is a 42yo female with no significant past medical history who presents to the emergency department for evaluation of itchy rash.  Patient reports that she developed a red, itchy raised rash on her bilateral shoulders 4 days ago.  States that it seems to be spreading to her abdomen and bilateral upper thighs.  She states that she started using a new body wash.  Otherwise no new foods, medications, lotions, soaps or detergents.  She denies shortness of breath, facial swelling, throat closing, fever, chills, break in skin.  She has been taking Claritin, does not like taking Benadryl because it makes her drowsy.  States that she has had a similar rash in the past and prednisone typically helps.  History reviewed. No pertinent past medical history.  There are no active problems to display for this patient.   Past Surgical History:  Procedure Laterality Date  . CESAREAN SECTION       OB History   None      Home Medications    Prior to Admission medications   Medication Sig Start Date End Date Taking? Authorizing Provider  cephALEXin (KEFLEX) 500 MG capsule Take 1 capsule (500 mg total) by mouth 4 (four) times daily. 07/15/16   Kirichenko, Tatyana, PA-C  LORazepam (ATIVAN) 1 MG tablet Take 1 tablet (1 mg total) by mouth at bedtime. Patient taking differently: Take 1 mg by mouth daily as needed (vertigo).  03/10/16   Bethel BornGekas, Kelly Marie, PA-C  meclizine (ANTIVERT) 25 MG tablet Take 1 tablet (25 mg total) by mouth 3 (three) times daily as needed for dizziness. Patient not taking: Reported on 07/15/2016 03/10/16   Bethel BornGekas, Kelly Marie, PA-C  predniSONE (STERAPRED UNI-PAK 21 TAB) 10 MG (21) TBPK tablet Take by mouth  daily. Take 6 tabs by mouth daily  for 2 days, then 5 tabs for 2 days, then 4 tabs for 2 days, then 3 tabs for 2 days, 2 tabs for 2 days, then 1 tab by mouth daily for 2 days 06/17/18   Kellie ShropshireShrosbree, Emily J, PA-C    Family History No family history on file.  Social History Social History   Tobacco Use  . Smoking status: Current Every Day Smoker    Last attempt to quit: 02/28/2015    Years since quitting: 3.3  . Smokeless tobacco: Never Used  Substance Use Topics  . Alcohol use: Yes    Comment: occasionally   . Drug use: No     Allergies   Fexofenadine hcl and Zyrtec [cetirizine hcl]   Review of Systems Review of Systems  Constitutional: Negative for chills and fever.  HENT: Negative for facial swelling and trouble swallowing.   Respiratory: Negative for shortness of breath.   Cardiovascular: Negative for chest pain.  Skin: Positive for color change and rash. Negative for wound.  Neurological: Negative for weakness and numbness.  Psychiatric/Behavioral: Negative for agitation.     Physical Exam Updated Vital Signs BP 129/90 (BP Location: Left Arm)   Pulse 85   Temp 98 F (36.7 C) (Oral)   Resp 18   SpO2 99%   Physical Exam  Constitutional: She is oriented to person, place, and time. She  appears well-developed and well-nourished. No distress.  HENT:  Head: Normocephalic and atraumatic.  No angioedema.  Airway patent and patient able to handle oral secretions.  Eyes: Right eye exhibits no discharge. Left eye exhibits no discharge.  Cardiovascular: Normal rate and regular rhythm.  Pulmonary/Chest: Effort normal and breath sounds normal. No stridor. No respiratory distress. She has no wheezes. She has no rales.  No respiratory distress, speaking in full sentences.  Lungs clear to auscultation.  Abdominal: Soft. There is no tenderness.  Neurological: She is alert and oriented to person, place, and time. Coordination normal.  Skin: Skin is warm and dry. She is not  diaphoretic.  Raised erythematous urticarial rash noted on bilateral shoulders, abdomen and bilateral inguinal area.  No overlying tenderness, warmth or induration.  Psychiatric: She has a normal mood and affect. Her behavior is normal.  Nursing note and vitals reviewed.    ED Treatments / Results  Labs (all labs ordered are listed, but only abnormal results are displayed) Labs Reviewed - No data to display  EKG None  Radiology No results found.  Procedures Procedures (including critical care time)  Medications Ordered in ED Medications  predniSONE (DELTASONE) tablet 60 mg (60 mg Oral Given 06/17/18 1657)     Initial Impression / Assessment and Plan / ED Course  I have reviewed the triage vital signs and the nursing notes.  Pertinent labs & imaging results that were available during my care of the patient were reviewed by me and considered in my medical decision making (see chart for details).     Patient presents with urticarial rash, likely related to new body wash.  Vital signs stable, no respiratory distress or anaphylaxis.  She has had similar rash in the past.  Patient will be started on prednisone taper.  Have counseled her on Benadryl and Claritin use at home for itching.  Follow-up with her primary care doctor in 2 to 3 days for recheck.  I have also provided her with information to the allergist, given this seems to be a recurring problem for her.  Discussed reasons to return to the emergency department and patient agrees and voiced understanding to the above plan and appears reliable for follow-up.  Final Clinical Impressions(s) / ED Diagnoses   Final diagnoses:  Allergic reaction, initial encounter    ED Discharge Orders        Ordered    predniSONE (STERAPRED UNI-PAK 21 TAB) 10 MG (21) TBPK tablet  Daily     06/17/18 1652       Kellie Shropshire, PA-C 06/17/18 1700    Lorre Nick, MD 06/17/18 2215

## 2018-06-17 NOTE — Discharge Instructions (Signed)
I written you prescription for prednisone.  Please take this as prescribed.  You received your first dose in the ER today.  You can take Claritin and Benadryl at home for itching.  I have listed the information below to the allergist.  Please call to schedule appointment for further evaluation.  Return to the ER if you have any new or concerning symptoms like trouble breathing, lip swelling, throat closing, fever.

## 2018-06-17 NOTE — ED Triage Notes (Signed)
Patient here from home with complaints of rash to bilateral shoulders. Reports that she had an allergic reaction to "something". States "I just need some prednisone". Denies SOB, throat swelling.

## 2018-10-16 ENCOUNTER — Other Ambulatory Visit: Payer: Self-pay

## 2018-10-16 ENCOUNTER — Encounter (HOSPITAL_COMMUNITY): Payer: Self-pay

## 2018-10-16 ENCOUNTER — Emergency Department (HOSPITAL_COMMUNITY)
Admission: EM | Admit: 2018-10-16 | Discharge: 2018-10-16 | Disposition: A | Discharge status: Home / Self Care | Payer: Self-pay | Attending: Emergency Medicine | Admitting: Emergency Medicine

## 2018-10-16 DIAGNOSIS — N751 Abscess of Bartholin's gland: Secondary | ICD-10-CM | POA: Insufficient documentation

## 2018-10-16 DIAGNOSIS — Z87891 Personal history of nicotine dependence: Secondary | ICD-10-CM | POA: Insufficient documentation

## 2018-10-16 MED ORDER — ACETAMINOPHEN 500 MG PO TABS
1000.0000 mg | ORAL_TABLET | Freq: Once | ORAL | Status: AC
  Administered 2018-10-16: 1000 mg via ORAL
  Filled 2018-10-16: qty 2

## 2018-10-16 MED ORDER — CEPHALEXIN 500 MG PO CAPS: 500.0000 mg | ORAL_CAPSULE | Freq: Four times a day (QID) | ORAL | 0 refills | Status: AC

## 2018-10-16 MED ORDER — HYDROCODONE-ACETAMINOPHEN 5-325 MG PO TABS: 1.0000 | ORAL_TABLET | Freq: Four times a day (QID) | ORAL | 0 refills | Status: DC | PRN

## 2018-10-16 NOTE — ED Provider Notes (Signed)
[facility]-EMERGENCY DEPT Provider Note   CSN: [id number] Arrival date & time: 10/16/18  0908     History   Chief Complaint Chief Complaint  Patient presents with  . Abscess    HPI Paula Ray is a 42 y.o. female who presents for evaluation of redness, swelling, pain to the left labia that is been ongoing for last 2 days.  Patient states that she tried doing warm baths at home with minimal improvement.  She states that yesterday, the pain worsened.  She states that this morning prior to coming to the ED, she noticed that the area had started to drain.  Patient reports she had a similar area to the right labia a few weeks ago that resolved on its own.  Patient states she has not had any fevers.  She has not take any medications for her symptoms.  The history is provided by the patient.    History reviewed. No pertinent past medical history.  There are no active problems to display for this patient.   Past Surgical History:  Procedure Laterality Date  . CESAREAN SECTION       OB History   None      Home Medications    Prior to Admission medications   Medication Sig Start Date End Date Taking? Authorizing Provider  cephALEXin (KEFLEX) 500 MG capsule Take 1 capsule (500 mg total) by mouth 4 (four) times daily for 7 days. 10/16/18 10/23/18  Maxwell Caul, PA-C  HYDROcodone-acetaminophen (NORCO/VICODIN) 5-325 MG tablet Take 1-2 tablets by mouth every 6 (six) hours as needed. 10/16/18   Maxwell Caul, PA-C  LORazepam (ATIVAN) 1 MG tablet Take 1 tablet (1 mg total) by mouth at bedtime. Patient not taking: Reported on 10/16/2018 03/10/16   Bethel Born, PA-C  meclizine (ANTIVERT) 25 MG tablet Take 1 tablet (25 mg total) by mouth 3 (three) times daily as needed for dizziness. Patient not taking: Reported on 07/15/2016 03/10/16   Bethel Born, PA-C  predniSONE (STERAPRED UNI-PAK 21 TAB) 10 MG (21) TBPK tablet Take by mouth daily. Take 6  tabs by mouth daily  for 2 days, then 5 tabs for 2 days, then 4 tabs for 2 days, then 3 tabs for 2 days, 2 tabs for 2 days, then 1 tab by mouth daily for 2 days Patient not taking: Reported on 10/16/2018 06/17/18   Kellie Shropshire, PA-C    Family History No family history on file.  Social History Social History   Tobacco Use  . Smoking status: Former Smoker    Last attempt to quit: 05/30/2018    Years since quitting: 0.3  . Smokeless tobacco: Never Used  Substance Use Topics  . Alcohol use: Yes    Comment: occasionally   . Drug use: No     Allergies   Fexofenadine hcl and Zyrtec [cetirizine hcl]   Review of Systems Review of Systems  Constitutional: Negative for fever.  Skin: Positive for color change and wound.  All other systems reviewed and are negative.    Physical Exam Updated Vital Signs BP 133/83   Pulse 90   Temp 98.5 F (36.9 C) (Oral)   Resp 18   Ht 5\' 4"  (1.626 m)   Wt 117.9 kg   LMP 09/16/2018   SpO2 100%   BMI 44.63 kg/m   Physical Exam  Constitutional: She appears well-developed and well-nourished.  HENT:  Head: Normocephalic and atraumatic.  Eyes: Conjunctivae and EOM are normal. Right  eye exhibits no discharge. Left eye exhibits no discharge. No scleral icterus.  Pulmonary/Chest: Effort normal.  Genitourinary:     Genitourinary Comments: The exam was performed with a chaperone present. Normal external female genitalia. No lesions, rash.  Erythema, edema noted to left labia with active purulent drainage.  There is some overlying tenderness palpation.  Neurological: She is alert.  Skin: Skin is warm and dry.  Psychiatric: She has a normal mood and affect. Her speech is normal and behavior is normal.  Nursing note and vitals reviewed.    ED Treatments / Results  Labs (all labs ordered are listed, but only abnormal results are displayed) Labs Reviewed - No data to display  EKG None  Radiology No results  found.  Procedures Procedures (including critical care time)  Medications Ordered in ED Medications  acetaminophen (TYLENOL) tablet 1,000 mg (1,000 mg Oral Given 10/16/18 1117)     Initial Impression / Assessment and Plan / ED Course  I have reviewed the triage vital signs and the nursing notes.  Pertinent labs & imaging results that were available during my care of the patient were reviewed by me and considered in my medical decision making (see chart for details).     42 year old female who presents for evaluation of pain, redness, swelling noted to left labia that has been ongoing for last 2 days.  Reports doing warm baths at home with minimal improvement. Patient is afebril, non-toxic appearing, sitting comfortably on examination table. Vital signs reviewed and stable.  On exam, she has redness, swelling, tenderness palpation of the left labia and the area is actively draining purulent drainage.  She is a small area where this abscess has opened up and is actively draining.  Exam is consistent with Bartholin's abscess.  Discussed treatment options with patient.  Offered to further open up the area where it is draining and insert a Word catheter but patient declined.  Given that it is actively draining, I feel that this is reasonable.  We will plan to start her on antibiotics.  Encourage at home supportive care measures.  Additionally, instructed patient to follow-up with OB/GYN in 24 to 48 hours for further evaluation. Patient had ample opportunity for questions and discussion. All patient's questions were answered with full understanding. Strict return precautions discussed. Patient expresses understanding and agreement to plan.   Final Clinical Impressions(s) / ED Diagnoses   Final diagnoses:  Bartholin's gland abscess    ED Discharge Orders         Ordered    cephALEXin (KEFLEX) 500 MG capsule  4 times daily     10/16/18 1110    HYDROcodone-acetaminophen (NORCO/VICODIN) 5-325 MG  tablet  Every 6 hours PRN     10/16/18 1110           Rosana Hoes 10/16/18 1426    Lorre Nick, MD 10/18/18 1217

## 2018-10-16 NOTE — Discharge Instructions (Signed)
You can take Tylenol or Ibuprofen as directed for pain. You can alternate Tylenol and Ibuprofen every 4 hours. If you take Tylenol at 1pm, then you can take Ibuprofen at 5pm. Then you can take Tylenol again at 9pm.   You can take pain medication for severe or breakthrough pain.   Take antibiotics as directed. Please take all of your antibiotics until finished.  As we discussed, apply warm compresses and engage in sitz bath to help with continued drainage.  You should apply pressure to the area to help it continue draining.  [facility] to 48 hours for wound recheck and reevaluation.  Return to emergency department for any worsening pain, redness or swelling, fevers or any other worsening or concerning symptoms.

## 2018-10-16 NOTE — ED Triage Notes (Signed)
Pt states that she has a cyst on her left inner labia. Pt states that she tried warm compress without relief. Pt states that she recently had one on the other side.

## 2019-02-12 ENCOUNTER — Encounter (HOSPITAL_COMMUNITY): Payer: Self-pay | Admitting: Emergency Medicine

## 2019-02-12 ENCOUNTER — Other Ambulatory Visit: Payer: Self-pay

## 2019-02-12 ENCOUNTER — Emergency Department (HOSPITAL_COMMUNITY)
Admission: EM | Admit: 2019-02-12 | Discharge: 2019-02-12 | Disposition: A | Discharge status: Home / Self Care | Payer: Self-pay | Attending: Emergency Medicine | Admitting: Emergency Medicine

## 2019-02-12 DIAGNOSIS — R5383 Other fatigue: Secondary | ICD-10-CM | POA: Insufficient documentation

## 2019-02-12 DIAGNOSIS — Z87891 Personal history of nicotine dependence: Secondary | ICD-10-CM | POA: Insufficient documentation

## 2019-02-12 DIAGNOSIS — R51 Headache: Secondary | ICD-10-CM | POA: Insufficient documentation

## 2019-02-12 DIAGNOSIS — R110 Nausea: Secondary | ICD-10-CM | POA: Insufficient documentation

## 2019-02-12 DIAGNOSIS — R42 Dizziness and giddiness: Secondary | ICD-10-CM | POA: Insufficient documentation

## 2019-02-12 LAB — COMPREHENSIVE METABOLIC PANEL
ALT: 12 U/L (ref 0–44)
AST: 13 U/L — ABNORMAL LOW (ref 15–41)
Albumin: 4.1 g/dL (ref 3.5–5.0)
Alkaline Phosphatase: 72 U/L (ref 38–126)
Anion gap: 5 (ref 5–15)
BUN: 13 mg/dL (ref 6–20)
CO2: 26 mmol/L (ref 22–32)
Calcium: 8.5 mg/dL — ABNORMAL LOW (ref 8.9–10.3)
Chloride: 108 mmol/L (ref 98–111)
Creatinine, Ser: 0.84 mg/dL (ref 0.44–1.00)
GFR calc Af Amer: >60 mL/min (ref >60)
GFR calc non Af Amer: >60 mL/min (ref >60)
Glucose, Bld: 100 mg/dL — ABNORMAL HIGH (ref 70–99)
POTASSIUM: 3.9 mmol/L (ref 3.5–5.1)
Sodium: 139 mmol/L (ref 135–145)
Total Bilirubin: 0.4 mg/dL (ref 0.3–1.2)
Total Protein: 7.3 g/dL (ref 6.5–8.1)

## 2019-02-12 LAB — CBC WITH DIFFERENTIAL/PLATELET
Abs Immature Granulocytes: 0.02 10*3/uL (ref 0.00–0.07)
Basophils Absolute: 0 10*3/uL (ref 0.0–0.1)
Basophils Relative: 0 %
Eosinophils Absolute: 0.1 10*3/uL (ref 0.0–0.5)
Eosinophils Relative: 2 %
HCT: 38.7 % (ref 36.0–46.0)
Hemoglobin: 12.6 g/dL (ref 12.0–15.0)
Immature Granulocytes: 0 %
Lymphocytes Relative: 27 %
Lymphs Abs: 1.8 10*3/uL (ref 0.7–4.0)
MCH: 29.9 pg (ref 26.0–34.0)
MCHC: 32.6 g/dL (ref 30.0–36.0)
MCV: 91.9 fL (ref 80.0–100.0)
Monocytes Absolute: 0.5 10*3/uL (ref 0.1–1.0)
Monocytes Relative: 8 %
NEUTROS ABS: 4.3 10*3/uL (ref 1.7–7.7)
Neutrophils Relative %: 63 %
Platelets: 399 10*3/uL (ref 150–400)
RBC: 4.21 MIL/uL (ref 3.87–5.11)
RDW: 12.2 % (ref 11.5–15.5)
WBC: 6.9 10*3/uL (ref 4.0–10.5)
nRBC: 0 % (ref 0.0–0.2)

## 2019-02-12 LAB — POC URINE PREG, ED: Preg Test, Ur: NEGATIVE

## 2019-02-12 LAB — CBG MONITORING, ED: Glucose-Capillary: 91 mg/dL (ref 70–99)

## 2019-02-12 MED ORDER — LACTATED RINGERS IV BOLUS
1000.0000 mL | Freq: Once | INTRAVENOUS | Status: AC
Start: 2019-02-12 — End: 2019-02-12
  Administered 2019-02-12: 1000 mL via INTRAVENOUS

## 2019-02-12 MED ORDER — ACETAMINOPHEN 325 MG PO TABS
650.0000 mg | ORAL_TABLET | Freq: Once | ORAL | Status: AC
  Administered 2019-02-12: 650 mg via ORAL
  Filled 2019-02-12: qty 2

## 2019-02-12 NOTE — ED Provider Notes (Signed)
[facility]-EMERGENCY DEPT Provider Note   CSN: [id number] Arrival date & time: 02/12/19  0946    History   Chief Complaint Chief Complaint  Patient presents with  . Dizziness  . Nausea    HPI Paula Ray is a 43 y.o. female.     HPI  43 year old female presents with lightheadedness and nausea.  She states this occurred to her about 8 days ago and then recurred last night.  This first started last night after getting off of the elliptical.  The lightheadedness is essentially constant and not really worse with position.  She has a little bit of a mild headache in the frontal head but no severe or thunderclap headache.  No vomiting but she feels slightly nauseous.  There is no chest pain, shortness of breath, abdominal pain, diarrhea or urinary symptoms.  No focal weakness but she feels diffusely fatigued.  She has gained weight since she stopped smoking last summer.  History reviewed. No pertinent past medical history.  There are no active problems to display for this patient.   Past Surgical History:  Procedure Laterality Date  . CESAREAN SECTION       OB History   No obstetric history on file.      Home Medications    Prior to Admission medications   Medication Sig Start Date End Date Taking? Authorizing Provider  HYDROcodone-acetaminophen (NORCO/VICODIN) 5-325 MG tablet Take 1-2 tablets by mouth every 6 (six) hours as needed. Patient not taking: Reported on 02/12/2019 10/16/18   Graciella Freer A, PA-C  LORazepam (ATIVAN) 1 MG tablet Take 1 tablet (1 mg total) by mouth at bedtime. Patient not taking: Reported on 02/12/2019 03/10/16   Bethel Born, PA-C  meclizine (ANTIVERT) 25 MG tablet Take 1 tablet (25 mg total) by mouth 3 (three) times daily as needed for dizziness. Patient not taking: Reported on 02/12/2019 03/10/16   Bethel Born, PA-C  predniSONE (STERAPRED UNI-PAK 21 TAB) 10 MG (21) TBPK tablet Take by mouth daily. Take 6 tabs by  mouth daily  for 2 days, then 5 tabs for 2 days, then 4 tabs for 2 days, then 3 tabs for 2 days, 2 tabs for 2 days, then 1 tab by mouth daily for 2 days Patient not taking: Reported on 02/12/2019 06/17/18   Kellie Shropshire, PA-C    Family History No family history on file.  Social History Social History   Tobacco Use  . Smoking status: Former Smoker    Last attempt to quit: 05/30/2018    Years since quitting: 0.7  . Smokeless tobacco: Never Used  Substance Use Topics  . Alcohol use: Yes    Comment: occasionally   . Drug use: No     Allergies   Fexofenadine hcl and Zyrtec [cetirizine hcl]   Review of Systems Review of Systems  Constitutional: Positive for fatigue. Negative for fever.  Respiratory: Negative for shortness of breath.   Cardiovascular: Negative for chest pain.  Gastrointestinal: Positive for nausea. Negative for abdominal pain and vomiting.  Genitourinary: Negative for dysuria.  Neurological: Positive for light-headedness and headaches. Negative for weakness and numbness.  All other systems reviewed and are negative.    Physical Exam Updated Vital Signs BP 123/85 (BP Location: Right Arm)   Pulse 70   Temp 98.3 F (36.8 C) (Oral)   Resp 16   Ht 5\' 3"  (1.6 m)   Wt 127 kg   SpO2 100%   BMI 49.60 kg/m  Physical Exam Vitals signs and nursing note reviewed.  Constitutional:      General: She is not in acute distress.    Appearance: She is well-developed. She is obese. She is not ill-appearing or diaphoretic.  HENT:     Head: Normocephalic and atraumatic.     Right Ear: External ear normal.     Left Ear: External ear normal.     Nose: Nose normal.  Eyes:     General:        Right eye: No discharge.        Left eye: No discharge.     Extraocular Movements: Extraocular movements intact.     Right eye: No nystagmus.     Left eye: No nystagmus.     Pupils: Pupils are equal, round, and reactive to light.  Cardiovascular:     Rate and Rhythm:  Normal rate and regular rhythm.     Heart sounds: Normal heart sounds. No murmur.  Pulmonary:     Effort: Pulmonary effort is normal.     Breath sounds: Normal breath sounds.  Abdominal:     Palpations: Abdomen is soft.     Tenderness: There is no abdominal tenderness.  Skin:    General: Skin is warm and dry.  Neurological:     Mental Status: She is alert.     Comments: CN 3-12 grossly intact. 5/5 strength in all 4 extremities. Grossly normal sensation. Normal finger to nose.   Psychiatric:        Mood and Affect: Mood is not anxious.      ED Treatments / Results  Labs (all labs ordered are listed, but only abnormal results are displayed) Labs Reviewed  COMPREHENSIVE METABOLIC PANEL - Abnormal; Notable for the following components:      Result Value   Glucose, Bld 100 (*)    Calcium 8.5 (*)    AST 13 (*)    All other components within normal limits  CBC WITH DIFFERENTIAL/PLATELET  CBG MONITORING, ED  POC URINE PREG, ED    EKG EKG Interpretation  Date/Time:  Tuesday February 12 2019 13:27:27 EST Ventricular Rate:  80 PR Interval:    QRS Duration: 64 QT Interval:  369 QTC Calculation: 426 R Axis:   49 Text Interpretation:  Sinus rhythm Low voltage, precordial leads Abnormal R-wave progression, early transition no significant change since 2017 Confirmed by Pricilla LovelessGoldston, Loveah Like [phone] on 02/12/2019 3:04:13 PM   Radiology No results found.  Procedures Procedures (including critical care time)  Medications Ordered in ED Medications  lactated ringers bolus 1,000 mL (0 mLs Intravenous Stopped 02/12/19 1521)  acetaminophen (TYLENOL) tablet 650 mg (650 mg Oral Given 02/12/19 1338)     Initial Impression / Assessment and Plan / ED Course  I have reviewed the triage vital signs and the nursing notes.  Pertinent labs & imaging results that were available during my care of the patient were reviewed by me and considered in my medical decision making (see chart for  details).        Patient was a little hypertensive on arrival but otherwise her vitals have been benign.  Exam is overall reassuring.  Mildly low calcium but otherwise work-up is pretty benign.  There is no clear cause for her lightheadedness.  She is in normal sinus rhythm.  While she is felt some on and off palpitations during this time, I doubt significant arrhythmia given she is having lightheadedness now but with normal sinus rhythm.  She feels a little  better with Tylenol and fluids.  She has not had a sudden severe headache or other concerning findings for an acute CNS process.  She is had vertigo before and this feels nothing like it and she is not having any trouble walking.  At this point she appears stable for discharge home and I have encouraged her to follow-up with a PCP.  Final Clinical Impressions(s) / ED Diagnoses   Final diagnoses:  Lightheadedness    ED Discharge Orders    None       Pricilla Loveless, MD 02/12/19 1545

## 2019-02-12 NOTE — ED Triage Notes (Signed)
Pt reports for week felt lightheaded and nauseated. Denies vomiting, bowel or bladder problems.

## 2019-02-12 NOTE — ED Notes (Signed)
Pt is alert and oriented x 4 and is verbally responsive. Pt reports having intermittent dizziness and nausea over a week. Pt soes state that she had started a new exercise routine on the elliptical machine last night and does report that symptoms had worsened this AM. Pt denies any episodes of vomiting. Denies any urinary symptoms, blood in her stool. Pt BP  Elevated at 154/102, pt denies any hx of HTN. Pt states that she has gained 30 lbs since June contributing weight gain to stress and smoking cessation.

## 2019-03-28 ENCOUNTER — Ambulatory Visit: Payer: Self-pay | Admitting: Family Medicine

## 2019-05-10 ENCOUNTER — Ambulatory Visit: Payer: Self-pay | Admitting: Family Medicine

## 2020-01-16 ENCOUNTER — Encounter (HOSPITAL_COMMUNITY): Payer: Self-pay

## 2020-01-16 ENCOUNTER — Other Ambulatory Visit: Payer: Self-pay

## 2020-01-16 ENCOUNTER — Emergency Department (HOSPITAL_COMMUNITY): Payer: Managed Care, Other (non HMO)

## 2020-01-16 ENCOUNTER — Emergency Department (HOSPITAL_COMMUNITY)
Admission: EM | Admit: 2020-01-16 | Discharge: 2020-01-16 | Disposition: A | Discharge status: Home / Self Care | Payer: Managed Care, Other (non HMO) | Attending: Emergency Medicine | Admitting: Emergency Medicine

## 2020-01-16 DIAGNOSIS — Z87891 Personal history of nicotine dependence: Secondary | ICD-10-CM | POA: Diagnosis not present

## 2020-01-16 DIAGNOSIS — R000 Tachycardia, unspecified: Secondary | ICD-10-CM | POA: Diagnosis not present

## 2020-01-16 DIAGNOSIS — F419 Anxiety disorder, unspecified: Secondary | ICD-10-CM | POA: Diagnosis not present

## 2020-01-16 DIAGNOSIS — R0602 Shortness of breath: Secondary | ICD-10-CM | POA: Diagnosis not present

## 2020-01-16 LAB — URINALYSIS, ROUTINE W REFLEX MICROSCOPIC
Bilirubin Urine: NEGATIVE
Glucose, UA: NEGATIVE mg/dL
Hgb urine dipstick: NEGATIVE
Ketones, ur: NEGATIVE mg/dL
Leukocytes,Ua: NEGATIVE
Nitrite: NEGATIVE
Protein, ur: NEGATIVE mg/dL
Specific Gravity, Urine: 1.004 — ABNORMAL LOW (ref 1.005–1.030)
pH: 7 (ref 5.0–8.0)

## 2020-01-16 LAB — CBC WITH DIFFERENTIAL/PLATELET
Abs Immature Granulocytes: 0.02 10*3/uL (ref 0.00–0.07)
Basophils Absolute: 0 10*3/uL (ref 0.0–0.1)
Basophils Relative: 0 %
Eosinophils Absolute: 0.1 10*3/uL (ref 0.0–0.5)
Eosinophils Relative: 1 %
HCT: 42.2 % (ref 36.0–46.0)
Hemoglobin: 13.6 g/dL (ref 12.0–15.0)
Immature Granulocytes: 0 %
Lymphocytes Relative: 29 %
Lymphs Abs: 2.4 10*3/uL (ref 0.7–4.0)
MCH: 29.7 pg (ref 26.0–34.0)
MCHC: 32.2 g/dL (ref 30.0–36.0)
MCV: 92.1 fL (ref 80.0–100.0)
Monocytes Absolute: 0.5 10*3/uL (ref 0.1–1.0)
Monocytes Relative: 6 %
Neutro Abs: 5.3 10*3/uL (ref 1.7–7.7)
Neutrophils Relative %: 64 %
Platelets: 415 10*3/uL — ABNORMAL HIGH (ref 150–400)
RBC: 4.58 MIL/uL (ref 3.87–5.11)
RDW: 12.4 % (ref 11.5–15.5)
WBC: 8.3 10*3/uL (ref 4.0–10.5)
nRBC: 0 % (ref 0.0–0.2)

## 2020-01-16 LAB — COMPREHENSIVE METABOLIC PANEL
ALT: 20 U/L (ref 0–44)
AST: 18 U/L (ref 15–41)
Albumin: 4.3 g/dL (ref 3.5–5.0)
Alkaline Phosphatase: 71 U/L (ref 38–126)
Anion gap: 9 (ref 5–15)
BUN: 12 mg/dL (ref 6–20)
CO2: 26 mmol/L (ref 22–32)
Calcium: 9.3 mg/dL (ref 8.9–10.3)
Chloride: 103 mmol/L (ref 98–111)
Creatinine, Ser: 0.84 mg/dL (ref 0.44–1.00)
GFR calc Af Amer: >60 mL/min (ref >60)
GFR calc non Af Amer: >60 mL/min (ref >60)
Glucose, Bld: 95 mg/dL (ref 70–99)
Potassium: 4 mmol/L (ref 3.5–5.1)
Sodium: 138 mmol/L (ref 135–145)
Total Bilirubin: 1 mg/dL (ref 0.3–1.2)
Total Protein: 8.1 g/dL (ref 6.5–8.1)

## 2020-01-16 LAB — TROPONIN I (HIGH SENSITIVITY)
Troponin I (High Sensitivity): <2 ng/L (ref <18)
Troponin I (High Sensitivity): 3 ng/L (ref <18)

## 2020-01-16 NOTE — ED Triage Notes (Signed)
Pt states that since Monday, she hasn't felt well. Pt states that she has had shortness of breath and has felt like her heart was racing. Pt states that she has never officially been dx with anxiety, but thinks she has been very anxious with the state of the world.

## 2020-01-16 NOTE — Discharge Instructions (Signed)
Schedule to see primary care for evaluation.  Return if any problems.

## 2020-01-16 NOTE — ED Provider Notes (Signed)
[facility]-EMERGENCY DEPT Provider Note   CSN: [id number] Arrival date & time: 01/16/20  1240     History Chief Complaint  Patient presents with  . Shortness of Breath  . Anxiety  . Tachycardia    Paula Ray is a 44 y.o. female.  The history is provided by the patient. No language interpreter was used.  Shortness of Breath Severity:  Moderate Onset quality:  Gradual Timing:  Constant Progression:  Worsening Chronicity:  New Context: not activity   Relieved by:  Nothing Worsened by:  Nothing Ineffective treatments:  None tried Associated symptoms: no abdominal pain and no chest pain   Risk factors: no recent alcohol use   Anxiety Associated symptoms include shortness of breath. Pertinent negatives include no chest pain and no abdominal pain.  Pt reports she has been feeling very anxious.  Pt reports she felt bad on Tuesday and took the day off work and tried to feel better.  Pt states she decided she needs to get labs checked.  Pt denies any chest pain.  Pt denies shortness of breath.  No nausea or vomiting     History reviewed. No pertinent past medical history.  There are no problems to display for this patient.   Past Surgical History:  Procedure Laterality Date  . CESAREAN SECTION       OB History   No obstetric history on file.     History reviewed. No pertinent family history.  Social History   Tobacco Use  . Smoking status: Former Smoker    Quit date: 05/30/2018    Years since quitting: 1.6  . Smokeless tobacco: Never Used  Substance Use Topics  . Alcohol use: Yes    Comment: occasionally   . Drug use: No    Home Medications Prior to Admission medications   Medication Sig Start Date End Date Taking? Authorizing Provider  HYDROcodone-acetaminophen (NORCO/VICODIN) 5-325 MG tablet Take 1-2 tablets by mouth every 6 (six) hours as needed. Patient not taking: Reported on 02/12/2019 10/16/18   Graciella Freer A, PA-C    LORazepam (ATIVAN) 1 MG tablet Take 1 tablet (1 mg total) by mouth at bedtime. Patient not taking: Reported on 02/12/2019 03/10/16   Bethel Born, PA-C  meclizine (ANTIVERT) 25 MG tablet Take 1 tablet (25 mg total) by mouth 3 (three) times daily as needed for dizziness. Patient not taking: Reported on 02/12/2019 03/10/16   Bethel Born, PA-C  predniSONE (STERAPRED UNI-PAK 21 TAB) 10 MG (21) TBPK tablet Take by mouth daily. Take 6 tabs by mouth daily  for 2 days, then 5 tabs for 2 days, then 4 tabs for 2 days, then 3 tabs for 2 days, 2 tabs for 2 days, then 1 tab by mouth daily for 2 days Patient not taking: Reported on 02/12/2019 06/17/18   Kellie Shropshire, PA-C    Allergies    Fexofenadine hcl and Zyrtec [cetirizine hcl]  Review of Systems   Review of Systems  Respiratory: Positive for shortness of breath.   Cardiovascular: Negative for chest pain.  Gastrointestinal: Negative for abdominal pain.  All other systems reviewed and are negative.   Physical Exam Updated Vital Signs BP 133/77 (BP Location: Right Arm)   Pulse 69   Temp 98.7 F (37.1 C) (Oral)   Resp 18   SpO2 100%   Physical Exam Vitals and nursing note reviewed.  Constitutional:      Appearance: She is well-developed.  HENT:     Head:  Normocephalic.  Cardiovascular:     Rate and Rhythm: Normal rate and regular rhythm.  Pulmonary:     Effort: Pulmonary effort is normal.     Breath sounds: Normal breath sounds. No decreased breath sounds.  Chest:     Chest wall: No mass or tenderness.  Abdominal:     General: There is no distension.  Musculoskeletal:        General: Normal range of motion.     Cervical back: Normal range of motion.  Skin:    General: Skin is warm.  Neurological:     Mental Status: She is alert and oriented to person, place, and time.  Psychiatric:        Mood and Affect: Mood normal.     ED Results / Procedures / Treatments   Labs (all labs ordered are listed, but only  abnormal results are displayed) Labs Reviewed  CBC WITH DIFFERENTIAL/PLATELET - Abnormal; Notable for the following components:      Result Value   Platelets 415 (*)    All other components within normal limits  URINALYSIS, ROUTINE W REFLEX MICROSCOPIC - Abnormal; Notable for the following components:   Color, Urine STRAW (*)    Specific Gravity, Urine 1.004 (*)    All other components within normal limits  COMPREHENSIVE METABOLIC PANEL  TROPONIN I (HIGH SENSITIVITY)  TROPONIN I (HIGH SENSITIVITY)    EKG EKG Interpretation  Date/Time:  Thursday January 16 2020 12:50:02 EST Ventricular Rate:  87 PR Interval:    QRS Duration: 62 QT Interval:  336 QTC Calculation: 405 R Axis:   58 Text Interpretation: Sinus rhythm Low voltage, precordial leads Abnormal R-wave progression, early transition Baseline wander in lead(s) II III aVF Confirmed by Madalyn Rob [phone]) on 01/16/2020 12:58:47 PM   Radiology DG Chest 2 View  Result Date: 01/16/2020 CLINICAL DATA:  Shortness of breath EXAM: CHEST - 2 VIEW COMPARISON:  07/15/2016 FINDINGS: The heart size and mediastinal contours are within normal limits. Both lungs are clear. The visualized skeletal structures are unremarkable. IMPRESSION: No acute abnormality of the lungs. Electronically Signed   By: Eddie Candle M.D.   On: 01/16/2020 13:16    Procedures Procedures (including critical care time)  Medications Ordered in ED Medications - No data to display  ED Course  I have reviewed the triage vital signs and the nursing notes.  Pertinent labs & imaging results that were available during my care of the patient were reviewed by me and considered in my medical decision making (see chart for details).    MDM Rules/Calculators/A&P                      MDM: Chest xray is normal.  Ekg is nonacute.  Labs reviewed.  Troponin negative.  No pe risk.  I suspect pt's symptoms are due to anxiety.   Final Clinical Impression(s) / ED  Diagnoses Final diagnoses:  Anxiety    Rx / DC Orders ED Discharge Orders    None    An After Visit Summary was printed and given to the patient.    Fransico Meadow, [place] 01/16/20 1833    Lucrezia Starch, MD 01/17/20 1159

## 2020-01-31 ENCOUNTER — Emergency Department (HOSPITAL_COMMUNITY)
Admission: EM | Admit: 2020-01-31 | Discharge: 2020-01-31 | Disposition: A | Discharge status: Home / Self Care | Payer: Managed Care, Other (non HMO) | Attending: Emergency Medicine | Admitting: Emergency Medicine

## 2020-01-31 ENCOUNTER — Other Ambulatory Visit: Payer: Self-pay

## 2020-01-31 DIAGNOSIS — K149 Disease of tongue, unspecified: Secondary | ICD-10-CM

## 2020-01-31 DIAGNOSIS — Z87891 Personal history of nicotine dependence: Secondary | ICD-10-CM | POA: Insufficient documentation

## 2020-01-31 DIAGNOSIS — R12 Heartburn: Secondary | ICD-10-CM

## 2020-01-31 NOTE — Discharge Instructions (Signed)
You are likely seeing vallate (circumvallate) papilla. These are normal and are essentially large taste buds. They are located at the base of our tongue and most of the time aren't readily visible. I suspect you noticed them today because you happened to be paying close attention.   In terms of your heart burn,  I would take TUMS as needed. You can also try an over the counter medication called famotidine (pepcid).

## 2020-01-31 NOTE — ED Provider Notes (Signed)
[facility] DEPT Provider Note   CSN: [id number] Arrival date & time: 01/31/20  1853     History Chief Complaint  Patient presents with  . bumps on tongue    Paula Ray is a 44 y.o. female.  HPI   44 year old female concerned over "bumps" at the back of her tongue.  She had some tingling of the tip of her tongue earlier today so she examined her tongue and saw large bumps at the back of it.  These did not seem to bother her though.  Denies any pain.  No dyspnea.  No difficulty breathing or swallowing.  Has been having "heartburn" intermittently over the past couple days which has been relieved with Tums.  Denies any other symptoms currently.  No new exposures that she is aware of.  No past medical history on file.  There are no problems to display for this patient.   Past Surgical History:  Procedure Laterality Date  . CESAREAN SECTION       OB History   No obstetric history on file.     No family history on file.  Social History   Tobacco Use  . Smoking status: Former Smoker    Quit date: 05/30/2018    Years since quitting: 1.6  . Smokeless tobacco: Never Used  Substance Use Topics  . Alcohol use: Yes    Comment: occasionally   . Drug use: No    Home Medications Prior to Admission medications   Not on File    Allergies    Fexofenadine hcl and Zyrtec [cetirizine hcl]  Review of Systems   Review of Systems   All systems reviewed and negative, other than as noted in HPI.   Physical Exam Updated Vital Signs BP (!) 159/97 (BP Location: Left Arm)   Pulse 76   Temp 98 F (36.7 C) (Oral)   Resp 17   Ht 5\' 3"  (1.6 m)   Wt 120.2 kg   LMP 01/31/2020   SpO2 100%   BMI 46.94 kg/m   Physical Exam Vitals and nursing note reviewed.  Constitutional:      General: She is not in acute distress.    Appearance: She is well-developed.  HENT:     Head: Normocephalic and atraumatic.     Comments: Oropharynx clear.  Tongue  grossly normal in appearance.  No concerning lesions noted.  Normal sounding voice.  Neck is supple.  No adenopathy. Eyes:     General:        Right eye: No discharge.        Left eye: No discharge.     Conjunctiva/sclera: Conjunctivae normal.  Cardiovascular:     Rate and Rhythm: Normal rate and regular rhythm.     Heart sounds: Normal heart sounds. No murmur. No friction rub. No gallop.   Pulmonary:     Effort: Pulmonary effort is normal. No respiratory distress.     Breath sounds: Normal breath sounds.  Abdominal:     General: There is no distension.     Palpations: Abdomen is soft.     Tenderness: There is no abdominal tenderness.  Musculoskeletal:        General: No tenderness.     Cervical back: Neck supple.  Skin:    General: Skin is warm and dry.  Neurological:     Mental Status: She is alert.  Psychiatric:        Behavior: Behavior normal.        Thought  Content: Thought content normal.     ED Results / Procedures / Treatments   Labs (all labs ordered are listed, but only abnormal results are displayed) Labs Reviewed - No data to display  EKG None  Radiology No results found.  Procedures Procedures (including critical care time)  Medications Ordered in ED Medications - No data to display  ED Course  I have reviewed the triage vital signs and the nursing notes.  Pertinent labs & imaging results that were available during my care of the patient were reviewed by me and considered in my medical decision making (see chart for details).    MDM Rules/Calculators/A&P                      44 year old female with "bumps" of the back of her tongue.  I suspect that she is simply visualizing her circumvallate papilla.  I do not see any concerning lesions.  Her exam is overall reassuring.  Briefly discussed tongue anatomy and tried to reassure her.  Return precautions discussed.               Final Clinical Impression(s) / ED Diagnoses Final diagnoses:  Tongue  irritation  Heart burn    Rx / DC Orders ED Discharge Orders    None       Raeford Razor, MD 01/31/20 Barry Brunner

## 2020-01-31 NOTE — ED Triage Notes (Signed)
Per patient, she has bumps on the back of her tongue. Patient said she noticed today around 3pm her tongue was tingling so she looked at her tongue and saw bumps on the very back. Patient denies pain. Denies eating anything new/different. Patient states she has also had terrible heartburn for the last week. No heartburn at this time. This nurse assessed tongue, unable to visualize said bumps.

## 2020-06-22 ENCOUNTER — Emergency Department (HOSPITAL_COMMUNITY): Payer: Managed Care, Other (non HMO)

## 2020-06-22 ENCOUNTER — Encounter (HOSPITAL_COMMUNITY): Payer: Self-pay

## 2020-06-22 ENCOUNTER — Other Ambulatory Visit: Payer: Self-pay

## 2020-06-22 ENCOUNTER — Emergency Department (HOSPITAL_COMMUNITY)
Admission: EM | Admit: 2020-06-22 | Discharge: 2020-06-22 | Disposition: A | Discharge status: Home / Self Care | Payer: Managed Care, Other (non HMO) | Attending: Emergency Medicine | Admitting: Emergency Medicine

## 2020-06-22 DIAGNOSIS — Z87891 Personal history of nicotine dependence: Secondary | ICD-10-CM | POA: Insufficient documentation

## 2020-06-22 DIAGNOSIS — R079 Chest pain, unspecified: Secondary | ICD-10-CM

## 2020-06-22 DIAGNOSIS — R519 Headache, unspecified: Secondary | ICD-10-CM | POA: Diagnosis not present

## 2020-06-22 DIAGNOSIS — R42 Dizziness and giddiness: Secondary | ICD-10-CM | POA: Insufficient documentation

## 2020-06-22 DIAGNOSIS — R110 Nausea: Secondary | ICD-10-CM | POA: Diagnosis not present

## 2020-06-22 DIAGNOSIS — R197 Diarrhea, unspecified: Secondary | ICD-10-CM | POA: Diagnosis not present

## 2020-06-22 LAB — CBC
HCT: 39.6 % (ref 36.0–46.0)
Hemoglobin: 13.2 g/dL (ref 12.0–15.0)
MCH: 30 pg (ref 26.0–34.0)
MCHC: 33.3 g/dL (ref 30.0–36.0)
MCV: 90 fL (ref 80.0–100.0)
Platelets: 405 10*3/uL — ABNORMAL HIGH (ref 150–400)
RBC: 4.4 MIL/uL (ref 3.87–5.11)
RDW: 12.3 % (ref 11.5–15.5)
WBC: 7 10*3/uL (ref 4.0–10.5)
nRBC: 0 % (ref 0.0–0.2)

## 2020-06-22 LAB — BASIC METABOLIC PANEL
Anion gap: 8 (ref 5–15)
BUN: 9 mg/dL (ref 6–20)
CO2: 25 mmol/L (ref 22–32)
Calcium: 9.7 mg/dL (ref 8.9–10.3)
Chloride: 107 mmol/L (ref 98–111)
Creatinine, Ser: 0.91 mg/dL (ref 0.44–1.00)
GFR calc Af Amer: >60 mL/min (ref >60)
GFR calc non Af Amer: >60 mL/min (ref >60)
Glucose, Bld: 103 mg/dL — ABNORMAL HIGH (ref 70–99)
Potassium: 4 mmol/L (ref 3.5–5.1)
Sodium: 140 mmol/L (ref 135–145)

## 2020-06-22 LAB — TROPONIN I (HIGH SENSITIVITY)
Troponin I (High Sensitivity): 2 ng/L (ref <18)
Troponin I (High Sensitivity): 2 ng/L (ref <18)

## 2020-06-22 LAB — I-STAT BETA HCG BLOOD, ED (NOT ORDERABLE): I-stat hCG, quantitative: <5 m[IU]/mL (ref <5)

## 2020-06-22 MED ORDER — SODIUM CHLORIDE 0.9% FLUSH: 3.0000 mL | Freq: Once | INTRAVENOUS | Status: DC

## 2020-06-22 MED ORDER — KETOROLAC TROMETHAMINE 30 MG/ML IJ SOLN: 30.0000 mg | Freq: Once | INTRAMUSCULAR | Status: DC

## 2020-06-22 NOTE — ED Provider Notes (Signed)
[facility] DEPT Provider Note   CSN: [id number] Arrival date & time: 06/22/20  1157     History Chief Complaint  Patient presents with  . Chest Pain    Paula Ray is a 44 y.o. female.  Paula Ray is a 44 y.o. female with a history of obesity, anxiety, and previous smoking history, who presents with chest pain.  Reports that last week she had a fluttering in her chest intermittently that seem to resolve and then yesterday she began noticing an intermittent left-sided chest pain that she describes as a pressure.  Chest pain comes and goes, but she is noticing more persistent pain in her arm which she describes as a tingling and heaviness, she states she is also intermittently had some pain up into her jaw and temple.  Pain is not worse with exertion, but she has not really tried to exert herself.  She denies associated shortness of breath, chest pain is not pleuritic.  She does report feeling some lightheadedness initially this morning that has since resolved and also felt a bit nauseated and her palms were sweaty.  She reports currently she does not have any chest pain but continues to have the same on feeling in her left arm.  She reports that symptoms started to really worry about her symptoms today during work and decided to come in for evaluation.  Has not smoked cigarettes in 2 years, reports occasional alcohol use and occasional marijuana use.  No history of hypertension, diabetes or high cholesterol.  Family history of hypertension but no known family history of heart attack.  Not on birth control, no recent long distance travel or surgery.  No history of PE or DVT.     HPI: A 44 year old patient with a history of obesity presents for evaluation of chest pain. Initial onset of pain was more than 6 hours ago. The patient's chest pain is described as heaviness/pressure/tightness and is not worse with exertion. The patient complains of nausea. The patient's  chest pain is middle- or left-sided, is not well-localized, is not sharp and does radiate to the arms/jaw/neck. The patient denies diaphoresis. The patient has no history of stroke, has no history of peripheral artery disease, has not smoked in the past 90 days, denies any history of treated diabetes, has no relevant family history of coronary artery disease (first degree relative at less than age 37), is not hypertensive and has no history of hypercholesterolemia.   History reviewed. No pertinent past medical history.  There are no problems to display for this patient.   Past Surgical History:  Procedure Laterality Date  . CESAREAN SECTION       OB History   No obstetric history on file.     Family History  Problem Relation Age of Onset  . Hypertension Mother     Social History   Tobacco Use  . Smoking status: Former Smoker    Quit date: 05/30/2018    Years since quitting: 2.0  . Smokeless tobacco: Never Used  Vaping Use  . Vaping Use: Never used  Substance Use Topics  . Alcohol use: Yes    Comment: occasionally   . Drug use: Yes    Types: Marijuana    Home Medications Prior to Admission medications   Medication Sig Start Date End Date Taking? Authorizing Provider  calcium carbonate (TUMS EX) 750 MG chewable tablet Chew 2 tablets by mouth daily as needed for heartburn.   Yes [provider]  Allergies    Fexofenadine hcl and Zyrtec [cetirizine hcl]  Review of Systems   Review of Systems  Constitutional: Negative for chills and fever.  HENT: Negative.   Eyes: Negative for visual disturbance.  Respiratory: Negative for cough and shortness of breath.   Cardiovascular: Positive for chest pain and palpitations. Negative for leg swelling.  Gastrointestinal: Positive for diarrhea and nausea. Negative for abdominal pain and vomiting.  Genitourinary: Negative for dysuria and flank pain.  Musculoskeletal: Positive for arthralgias and myalgias. Negative for back  pain.  Skin: Negative for color change and rash.  Neurological: Positive for light-headedness and headaches. Negative for dizziness, syncope, weakness and numbness.    Physical Exam Updated Vital Signs BP (!) 141/78 (BP Location: Right Arm)   Pulse 78   Temp 99.2 F (37.3 C) (Oral)   Resp 15   Ht 5\' 4"  (1.626 m)   Wt 113.4 kg   LMP 06/13/2020   SpO2 97%   BMI 42.91 kg/m   Physical Exam Vitals and nursing note reviewed.  Constitutional:      General: She is not in acute distress.    Appearance: She is well-developed. She is obese. She is not ill-appearing or diaphoretic.     Comments: Well-appearing and in no distress  HENT:     Head: Normocephalic and atraumatic.  Eyes:     General:        Right eye: No discharge.        Left eye: No discharge.     Pupils: Pupils are equal, round, and reactive to light.  Cardiovascular:     Rate and Rhythm: Normal rate and regular rhythm.     Pulses:          Radial pulses are 2+ on the right side and 2+ on the left side.       Dorsalis pedis pulses are 2+ on the right side and 2+ on the left side.     Heart sounds: Normal heart sounds. No murmur heard.  No friction rub. No gallop.   Pulmonary:     Effort: Pulmonary effort is normal. No respiratory distress.     Breath sounds: Normal breath sounds. No wheezing or rales.     Comments: Respirations equal and unlabored, patient able to speak in full sentences, lungs clear to auscultation bilaterally Chest:     Chest wall: No tenderness.     Comments: Chest wall nontender to palpation Abdominal:     General: Bowel sounds are normal. There is no distension.     Palpations: Abdomen is soft. There is no mass.     Tenderness: There is no abdominal tenderness. There is no guarding.     Comments: Abdomen soft, nondistended, nontender to palpation in all quadrants without guarding or peritoneal signs  Musculoskeletal:        General: No deformity.     Cervical back: Neck supple.     Right  lower leg: No tenderness. No edema.     Left lower leg: No tenderness. No edema.  Skin:    General: Skin is warm and dry.     Capillary Refill: Capillary refill takes less than 2 seconds.  Neurological:     Mental Status: She is alert.     Coordination: Coordination normal.     Comments: Speech is clear, able to follow commands Moves extremities without ataxia, coordination intact  Psychiatric:        Mood and Affect: Mood is anxious.  Behavior: Behavior normal.     ED Results / Procedures / Treatments   Labs (all labs ordered are listed, but only abnormal results are displayed) Labs Reviewed  BASIC METABOLIC PANEL - Abnormal; Notable for the following components:      Result Value   Glucose, Bld 103 (*)    All other components within normal limits  CBC - Abnormal; Notable for the following components:   Platelets 405 (*)    All other components within normal limits  I-STAT BETA HCG BLOOD, ED ([facility], [facility], [facility] ONLY)  I-STAT BETA HCG BLOOD, ED (NOT ORDERABLE)  TROPONIN I (HIGH SENSITIVITY)  TROPONIN I (HIGH SENSITIVITY)    EKG EKG Interpretation  Date/Time:  Monday June 22 2020 12:06:22 EDT Ventricular Rate:  88 PR Interval:    QRS Duration: 89 QT Interval:  348 QTC Calculation: 421 R Axis:   57 Text Interpretation: Sinus rhythm Low voltage, precordial leads Baseline wander in lead(s) I 12 Lead; Mason-Likar No significant change since last tracing Confirmed by Richardean Canal [phone]) on 06/22/2020 3:48:47 PM   Radiology DG Chest 2 View  Result Date: 06/22/2020 CLINICAL DATA:  Chest pain. EXAM: CHEST - 2 VIEW COMPARISON:  Prior chest radiograph 06/15/2020 and earlier FINDINGS: Heart size within normal limits. There is no appreciable airspace consolidation. No evidence of pleural effusion or pneumothorax. No acute bony abnormality identified. IMPRESSION: No evidence of acute cardiopulmonary abnormality. Electronically Signed   By: Jackey Loge DO   On: 06/22/2020 12:31     Procedures Procedures (including critical care time)  Medications Ordered in ED Medications  sodium chloride flush (NS) 0.9 % injection 3 mL (0 mLs Intravenous Hold 06/22/20 1616)    ED Course  I have reviewed the triage vital signs and the nursing notes.  Pertinent labs & imaging results that were available during my care of the patient were reviewed by me and considered in my medical decision making (see chart for details).    MDM Rules/Calculators/A&P  Patient presents to the emergency department with chest pain. Patient nontoxic appearing, in no apparent distress, vitals without significant abnormality. Fairly benign physical exam.   Prior heart catheterization reviewed: None Patient's primary cardiologist: None  DDX: ACS, pulmonary embolism, dissection, pneumothorax, pneumonia, arrhythmia, severe anemia, MSK, GERD, anxiety. Evaluation initiated with labs, EKG, and CXR. Patient on cardiac monitor.   I have independently ordered, reviewed and interpreted all labs and imaging: CBC: No leukocytosis, normal hemoglobin BMP: Glucose of 103, no other electrolyte derangements, normal renal function Troponin: Negative x2 EKG: Sinus rhythm with no concerning ischemic changes CXR:  Negative, without infiltrate, effusion, pneumothorax, or fracture/dislocation.   [place] Pathway Score 3- EKG without obvious acute ischemia, delta troponin negative, doubt ACS. Patient is low risk wells, PERC negative, doubt pulmonary embolism. Pain is not a tearing sensation, symmetric pulses, no widening of mediastinum on CXR, doubt dissection.  Patient has remained chest pain-free during ED evaluation.  Cardiac monitor reviewed, no notable arrhythmias or tachycardia. Patient has appeared hemodynamically stable throughout ER visit and appears safe for discharge with close PCP/cardiology follow up.  Ambulatory referral to cardiology placed.  I discussed results, treatment plan, need for PCP follow-up, and  return precautions with the patient. Provided opportunity for questions, patient confirmed understanding and is in agreement with plan.    Final Clinical Impression(s) / ED Diagnoses Final diagnoses:  Left-sided chest pain    Rx / DC Orders ED Discharge Orders         Ordered  Ambulatory referral to Cardiology     Discontinue  Reprint     06/22/20 1823           Legrand Rams 06/22/20 1825    Charlynne Pander, MD 06/22/20 2300

## 2020-06-22 NOTE — ED Triage Notes (Signed)
Patient states she had fluttering in her chest last week. Patient c/o left chest pain, left jaw pain, dizziness, and left arm pain since yesterday.

## 2020-06-22 NOTE — Discharge Instructions (Addendum)
You were seen in the emergency department today for chest pain. Your work-up in the emergency department has been overall reassuring. Your labs have been fairly normal and or similar to previous blood work you have had done. Your EKG and the enzyme we use to check your heart did not show an acute heart attack at this time. Your chest x-ray was normal.  ° °We would like you to follow up closely with your primary care provider and/or the cardiologist provided in your discharge instructions within 1-3 days. Return to the ER immediately should you experience any new or worsening symptoms including but not limited to return of pain, worsened pain, vomiting, shortness of breath, dizziness, lightheadedness, passing out, or any other concerns that you may have.   °

## 2020-06-22 NOTE — ED Notes (Signed)
Sent down a blue top to lab

## 2020-07-06 ENCOUNTER — Emergency Department (HOSPITAL_COMMUNITY)
Admission: EM | Admit: 2020-07-06 | Discharge: 2020-07-06 | Disposition: A | Discharge status: Home / Self Care | Payer: Managed Care, Other (non HMO) | Attending: Emergency Medicine | Admitting: Emergency Medicine

## 2020-07-06 ENCOUNTER — Emergency Department (HOSPITAL_COMMUNITY): Payer: Managed Care, Other (non HMO)

## 2020-07-06 ENCOUNTER — Encounter (HOSPITAL_COMMUNITY): Payer: Self-pay

## 2020-07-06 ENCOUNTER — Other Ambulatory Visit: Payer: Self-pay

## 2020-07-06 DIAGNOSIS — Z87891 Personal history of nicotine dependence: Secondary | ICD-10-CM | POA: Diagnosis not present

## 2020-07-06 DIAGNOSIS — R0789 Other chest pain: Secondary | ICD-10-CM | POA: Diagnosis present

## 2020-07-06 LAB — URINALYSIS, ROUTINE W REFLEX MICROSCOPIC
Bacteria, UA: NONE SEEN
Bilirubin Urine: NEGATIVE
Glucose, UA: NEGATIVE mg/dL
Ketones, ur: NEGATIVE mg/dL
Leukocytes,Ua: NEGATIVE
Nitrite: NEGATIVE
Protein, ur: NEGATIVE mg/dL
Specific Gravity, Urine: 1.023 (ref 1.005–1.030)
pH: 5 (ref 5.0–8.0)

## 2020-07-06 LAB — CBC
HCT: 38.9 % (ref 36.0–46.0)
Hemoglobin: 13 g/dL (ref 12.0–15.0)
MCH: 30.4 pg (ref 26.0–34.0)
MCHC: 33.4 g/dL (ref 30.0–36.0)
MCV: 91.1 fL (ref 80.0–100.0)
Platelets: 358 10*3/uL (ref 150–400)
RBC: 4.27 MIL/uL (ref 3.87–5.11)
RDW: 12.3 % (ref 11.5–15.5)
WBC: 6.4 10*3/uL (ref 4.0–10.5)
nRBC: 0 % (ref 0.0–0.2)

## 2020-07-06 LAB — TROPONIN I (HIGH SENSITIVITY): Troponin I (High Sensitivity): 2 ng/L (ref <18)

## 2020-07-06 LAB — BASIC METABOLIC PANEL
Anion gap: 10 (ref 5–15)
BUN: 18 mg/dL (ref 6–20)
CO2: 23 mmol/L (ref 22–32)
Calcium: 9.1 mg/dL (ref 8.9–10.3)
Chloride: 106 mmol/L (ref 98–111)
Creatinine, Ser: 0.77 mg/dL (ref 0.44–1.00)
GFR calc Af Amer: >60 mL/min (ref >60)
GFR calc non Af Amer: >60 mL/min (ref >60)
Glucose, Bld: 88 mg/dL (ref 70–99)
Potassium: 4.1 mmol/L (ref 3.5–5.1)
Sodium: 139 mmol/L (ref 135–145)

## 2020-07-06 LAB — I-STAT BETA HCG BLOOD, ED (MC, WL, AP ONLY): I-stat hCG, quantitative: <5 m[IU]/mL (ref <5)

## 2020-07-06 MED ORDER — SODIUM CHLORIDE 0.9% FLUSH
3.0000 mL | Freq: Once | INTRAVENOUS | Status: AC
  Administered 2020-07-06: 3 mL via INTRAVENOUS

## 2020-07-06 NOTE — ED Notes (Signed)
ED Provider at bedside. 

## 2020-07-06 NOTE — Discharge Instructions (Addendum)
Please return for any problem.  Follow-up with cardiology as previously arranged.

## 2020-07-06 NOTE — ED Provider Notes (Signed)
[facility]-EMERGENCY DEPT Provider Note   CSN: [id number] Arrival date & time: 07/06/20  4098     History Chief Complaint  Patient presents with  . Chest Pain    Paula Ray is a 44 y.o. female.  44 year old female with prior medical history as detailed below presents for evaluation of left-sided chest discomfort.  Patient reports intermittent left-sided chest discomfort that has been ongoing since her last ED evaluation for similar symptoms.  She denies associated fever.  She denies acute shortness of breath.  She reports that her dull left-sided chest discomfort has been fairly constant since her last ED evaluation.  She reports that she does have an appointment with cardiology at the end of this month.  She became concerned that her symptoms were ongoing and decided to return to the ED for reevaluation.  The history is provided by the patient.  Chest Pain Pain location:  L chest Pain quality: aching and pressure   Pain radiates to:  Does not radiate Pain severity:  Mild Onset quality:  Gradual Duration:  2 weeks Timing:  Intermittent Progression:  Waxing and waning Chronicity:  Recurrent Relieved by:  Nothing Worsened by:  Nothing      History reviewed. No pertinent past medical history.  There are no problems to display for this patient.   Past Surgical History:  Procedure Laterality Date  . CESAREAN SECTION       OB History   No obstetric history on file.     Family History  Problem Relation Age of Onset  . Hypertension Mother     Social History   Tobacco Use  . Smoking status: Former Smoker    Quit date: 05/30/2018    Years since quitting: 2.1  . Smokeless tobacco: Never Used  Vaping Use  . Vaping Use: Never used  Substance Use Topics  . Alcohol use: Yes    Comment: occasionally   . Drug use: Yes    Types: Marijuana    Home Medications Prior to Admission medications   Medication Sig Start Date End Date Taking?  Authorizing Provider  calcium carbonate (TUMS EX) 750 MG chewable tablet Chew 2 tablets by mouth daily as needed for heartburn.    [provider]    Allergies    Fexofenadine hcl and Zyrtec [cetirizine hcl]  Review of Systems   Review of Systems  Cardiovascular: Positive for chest pain.  All other systems reviewed and are negative.   Physical Exam Updated Vital Signs BP 117/78   Pulse 69   Temp 99 F (37.2 C) (Oral)   Resp 17   Ht 5\' 4"  (1.626 m)   Wt 115.7 kg   LMP 06/13/2020   SpO2 100%   BMI 43.77 kg/m   Physical Exam Vitals and nursing note reviewed.  Constitutional:      General: She is not in acute distress.    Appearance: She is well-developed.  HENT:     Head: Normocephalic and atraumatic.  Eyes:     Conjunctiva/sclera: Conjunctivae normal.     Pupils: Pupils are equal, round, and reactive to light.  Cardiovascular:     Rate and Rhythm: Normal rate and regular rhythm.     Heart sounds: Normal heart sounds.  Pulmonary:     Effort: Pulmonary effort is normal. No respiratory distress.     Breath sounds: Normal breath sounds.  Abdominal:     General: There is no distension.     Palpations: Abdomen is soft.  Tenderness: There is no abdominal tenderness.  Musculoskeletal:        General: No deformity. Normal range of motion.     Cervical back: Normal range of motion and neck supple.  Skin:    General: Skin is warm and dry.  Neurological:     Mental Status: She is alert and oriented to person, place, and time.     ED Results / Procedures / Treatments   Labs (all labs ordered are listed, but only abnormal results are displayed) Labs Reviewed  URINALYSIS, ROUTINE W REFLEX MICROSCOPIC - Abnormal; Notable for the following components:      Result Value   Color, Urine AMBER (*)    Hgb urine dipstick SMALL (*)    All other components within normal limits  BASIC METABOLIC PANEL  CBC  I-STAT BETA HCG BLOOD, ED ([facility], [facility], [facility] ONLY)  TROPONIN I  (HIGH SENSITIVITY)  TROPONIN I (HIGH SENSITIVITY)    EKG EKG Interpretation  Date/Time:  Monday July 06 2020 09:35:44 EDT Ventricular Rate:  83 PR Interval:    QRS Duration: 67 QT Interval:  343 QTC Calculation: 403 R Axis:   59 Text Interpretation: Sinus rhythm Abnormal R-wave progression, early transition 12 Lead; Mason-Likar Confirmed by Kristine Royal [phone]) on 07/06/2020 11:43:30 AM   Radiology DG Chest 2 View  Result Date: 07/06/2020 CLINICAL DATA:  Chest pain. EXAM: CHEST - 2 VIEW COMPARISON:  June 22, 2020. FINDINGS: The heart size and mediastinal contours are within normal limits. Both lungs are clear. No pneumothorax or pleural effusion is noted. The visualized skeletal structures are unremarkable. IMPRESSION: No active cardiopulmonary disease. Electronically Signed   By: Lupita Raider M.D.   On: 07/06/2020 10:18    Procedures Procedures (including critical care time)  Medications Ordered in ED Medications  sodium chloride flush (NS) 0.9 % injection 3 mL (3 mLs Intravenous Given 07/06/20 1215)    ED Course  I have reviewed the triage vital signs and the nursing notes.  Pertinent labs & imaging results that were available during my care of the patient were reviewed by me and considered in my medical decision making (see chart for details).    MDM Rules/Calculators/A&P                            MDM  Screen complete  Paula Ray was evaluated in Emergency Department on 07/06/2020 for the symptoms described in the history of present illness. She was evaluated in the context of the global COVID-19 pandemic, which necessitated consideration that the patient might be at risk for infection with the SARS-CoV-2 virus that causes COVID-19. Institutional protocols and algorithms that pertain to the evaluation of patients at risk for COVID-19 are in a state of rapid change based on information released by regulatory bodies including the CDC and federal and state  organizations. These policies and algorithms were followed during the patient's care in the ED.  Patient is presenting for evaluation of atypical chest discomfort.  Patient's describe symptoms are not consistent with likely ACS.  Patient has similar at pain last week and was seen in the ED.  Work-up at that time did not demonstrate significant acute abnormality.  She is already arranged cardiology follow-up for the 30th of this month.  Work-up today did not demonstrate significant acute abnormality.  Patient appears to be appropriate for discharge.     Final Clinical Impression(s) / ED Diagnoses Final diagnoses:  Chest pain, atypical  Rx / DC Orders ED Discharge Orders    None       Wynetta Fines, MD 07/06/20 1410

## 2020-07-06 NOTE — ED Triage Notes (Signed)
Patient c/o intermittent burning under the left breast and pain in her left arm since yesterday. Patient states she was seen last week for chest pain and was referred to a cardiologist. Patient states she can not get an appointment until 07/24/20

## 2020-07-22 NOTE — Progress Notes (Signed)
Cardiology Office Note:   Date:  07/24/2020  NAME:  Paula Ray    MRN: [id number] DOB:  [birth year]   PCP:  Verlon Au, MD  Cardiologist:  No primary care provider on file.   Referring MD: Dartha Lodge, PA-C   Chief Complaint  Patient presents with  . Chest Pain    History of Present Illness:   Paula Ray is a 44 y.o. female with a hx of HTN who is being seen today for the evaluation of chest pain at the request of Verlon Au, MD. Recent evaluation in the ER x 2 for chest pain. Troponin negative. EKG normal. She reports she went to the emergency room with left arm pain.  She reports she is had significant stress and anxiety due to the coronavirus pandemic at work.  Apparently her symptoms began in the left arm and was described as sharp pain occurring intermittently.  The pain occurred at rest.  It was not worsened by activity.  She also reports the pain then began to go into her left breast.  She reports a sharp pain as well.  The pain would come and go.  She reports it was not worsened by exertion and was actually worsened by stress.  She went to the emergency room and when she was told her heart was okay she had no further episodes of pain.  She reports she not had any episodes since her emergency room visit.  She has no medical history of hypertension or hyperlipidemia.  Her main CVD risk factor is morbid obesity.  She has no sleep apnea.  She does work as a Occupational psychologist for [employer].  She is a single mother and reports this is quite stressful with her 11 year old daughter.  She reports that she has been very stressed as she has been at home and scared to be around people.  She is quite concerned about catching coronavirus and having a bad outcome.  She is contemplating the coronavirus vaccine and plans to get this done in the next few days.  She reports she does not exercise routinely but has no limitations such as chest pain or shortness of breath with  her current level of activity.  She reports she walked in the building without any major limitations.  She can climb several flights of stairs without any limitations.  She is a former smoker but quit a number of years ago.  No excess alcohol use reported.  No drug use reported.  There is no strong family history of heart disease.  Her EKG today is normal.  Her cardiovascular exam is normal.  Past Medical History: History reviewed. No pertinent past medical history.  Past Surgical History: Past Surgical History:  Procedure Laterality Date  . CESAREAN SECTION      Current Medications: Current Meds  Medication Sig  . acetaminophen (TYLENOL) 325 MG tablet Take 650 mg by mouth every 6 (six) hours as needed for mild pain or headache.  . calcium carbonate (TUMS EX) 750 MG chewable tablet Chew 2 tablets by mouth daily as needed for heartburn.  . Prenatal Vit-Fe Fumarate-FA (PRENATAL PO) Take 1 tablet by mouth daily.     Allergies:    Fexofenadine hcl and Zyrtec [cetirizine hcl]   Social History: Social History   Socioeconomic History  . Marital status: Legally Separated    Spouse name: Not on file  . Number of children: 1  . Years of education: Not on file  . Highest  education level: Not on file  Occupational History  . Not on file  Tobacco Use  . Smoking status: Former Smoker    Years: 20.00    Quit date: 05/30/2018    Years since quitting: 2.1  . Smokeless tobacco: Never Used  Vaping Use  . Vaping Use: Never used  Substance and Sexual Activity  . Alcohol use: Yes    Comment: occasionally   . Drug use: Yes    Types: Marijuana  . Sexual activity: Yes    Birth control/protection: None  Other Topics Concern  . Not on file  Social History Narrative  . Not on file   Social Determinants of Health   Financial Resource Strain:   . Difficulty of Paying Living Expenses:   Food Insecurity:   . Worried About Programme researcher, broadcasting/film/video in the Last Year:   . Barista in the Last  Year:   Transportation Needs:   . Freight forwarder (Medical):   Marland Kitchen Lack of Transportation (Non-Medical):   Physical Activity:   . Days of Exercise per Week:   . Minutes of Exercise per Session:   Stress:   . Feeling of Stress :   Social Connections:   . Frequency of Communication with Friends and Family:   . Frequency of Social Gatherings with Friends and Family:   . Attends Religious Services:   . Active Member of Clubs or Organizations:   . Attends Banker Meetings:   Marland Kitchen Marital Status:      Family History: The patient's family history includes Hypertension in her mother.  ROS:   All other ROS reviewed and negative. Pertinent positives noted in the HPI.     EKGs/Labs/Other Studies Reviewed:   The following studies were personally reviewed by me today:  EKG:  EKG is ordered today.  The ekg ordered today demonstrates normal sinus rhythm, heart rate 81, no acute ST-T changes, no evidence of prior infarction, and was personally reviewed by me.   Recent Labs: 01/16/2020: ALT 20 07/06/2020: BUN 18; Creatinine, Ser 0.77; Hemoglobin 13.0; Platelets 358; Potassium 4.1; Sodium 139   Recent Lipid Panel No results found for: CHOL, TRIG, HDL, CHOLHDL, VLDL, LDLCALC, LDLDIRECT  Physical Exam:   VS:  BP (!) 129/84   Pulse 81   Ht 5' 0.5" (1.537 m)   Wt (!) 258 lb 6.4 oz (117.2 kg)   SpO2 100%   BMI 49.63 kg/m    Wt Readings from Last 3 Encounters:  07/24/20 (!) 258 lb 6.4 oz (117.2 kg)  07/06/20 255 lb (115.7 kg)  06/22/20 250 lb (113.4 kg)    General: Well nourished, well developed, in no acute distress Heart: Atraumatic, normal size  Eyes: PEERLA, EOMI  Neck: Supple, no JVD Endocrine: No thryomegaly Cardiac: Normal S1, S2; RRR; no murmurs, rubs, or gallops Lungs: Clear to auscultation bilaterally, no wheezing, rhonchi or rales  Abd: Soft, nontender, no hepatomegaly  Ext: No edema, pulses 2+ Musculoskeletal: No deformities, BUE and BLE strength normal and  equal Skin: Warm and dry, no rashes   Neuro: Alert and oriented to person, place, time, and situation, CNII-XII grossly intact, no focal deficits  Psych: Normal mood and affect   ASSESSMENT:   [name] is a 44 y.o. female who presents for the following: 1. Chest pain, unspecified type     PLAN:   1. Chest pain, unspecified type -Atypical chest pain.  EKG is normal.  Cardiovascular exam is normal.  Her symptoms  are clearly related to stress.  I have encouraged her to continue to exercise and to discuss with her primary care physician if she needs to go on depression medication.  She said no further episodes of symptoms.  I encouraged her to exercise and to pursue stress reduction strategies.  I have not recommended any further testing at this time.  Given her normal EKG, atypical chest pain and normal cardiovascular exam, I have a very low suspicion for any underlying cardiac disorder.  She will see [place] on an as-needed basis.  Disposition: Return if symptoms worsen or fail to improve.  Medication Adjustments/Labs and Tests Ordered: Current medicines are reviewed at length with the patient today.  Concerns regarding medicines are outlined above.  Orders Placed This Encounter  Procedures  . EKG 12-Lead   No orders of the defined types were placed in this encounter.   Patient Instructions  Medication Instructions:  The current medical regimen is effective;  continue present plan and medications.  *If you need a refill on your cardiac medications before your next appointment, please call your pharmacy*   Follow-Up: At [facility], you and your health needs are our priority.  As part of our continuing mission to provide you with exceptional heart care, we have created designated Provider Care Teams.  These Care Teams include your primary Cardiologist (physician) and Advanced Practice Providers (APPs -  Physician Assistants and Nurse Practitioners) who all work together to provide you  with the care you need, when you need it.  We recommend signing up for the patient portal called "MyChart".  Sign up information is provided on this After Visit Summary.  MyChart is used to connect with patients for Virtual Visits (Telemedicine).  Patients are able to view lab/test results, encounter notes, upcoming appointments, etc.  Non-urgent messages can be sent to your provider as well.   To learn more about what you can do with MyChart, go to [email or web address].    Your next appointment:   As needed  The format for your next appointment:   In Person  Provider:   Lennie Odor, MD       Signed, Lenna Gilford. Flora Lipps, MD [facility]  [address] [place], [place] [address] [phone]  07/24/2020 4:46 PM

## 2020-07-24 ENCOUNTER — Other Ambulatory Visit: Payer: Self-pay

## 2020-07-24 ENCOUNTER — Ambulatory Visit (INDEPENDENT_AMBULATORY_CARE_PROVIDER_SITE_OTHER): Payer: Managed Care, Other (non HMO) | Admitting: Cardiovascular Disease

## 2020-07-24 ENCOUNTER — Encounter: Payer: Self-pay | Admitting: Cardiovascular Disease

## 2020-07-24 VITALS — BP 129/84 | HR 81 | Ht 60.5 in | Wt 258.4 lb

## 2020-07-24 DIAGNOSIS — R079 Chest pain, unspecified: Secondary | ICD-10-CM

## 2020-07-24 NOTE — Patient Instructions (Signed)
Medication Instructions:  The current medical regimen is effective;  continue present plan and medications.  *If you need a refill on your cardiac medications before your next appointment, please call your pharmacy*    Follow-Up: At [facility], you and your health needs are our priority.  As part of our continuing mission to provide you with exceptional heart care, we have created designated Provider Care Teams.  These Care Teams include your primary Cardiologist (physician) and Advanced Practice Providers (APPs -  Physician Assistants and Nurse Practitioners) who all work together to provide you with the care you need, when you need it.  We recommend signing up for the patient portal called "MyChart".  Sign up information is provided on this After Visit Summary.  MyChart is used to connect with patients for Virtual Visits (Telemedicine).  Patients are able to view lab/test results, encounter notes, upcoming appointments, etc.  Non-urgent messages can be sent to your provider as well.   To learn more about what you can do with MyChart, go to [email or web address].    Your next appointment:   As needed  The format for your next appointment:   In Person  Provider:   Dob[place] Ferry O'Neal, MD      

## 2020-11-07 ENCOUNTER — Encounter (HOSPITAL_COMMUNITY): Payer: Self-pay

## 2020-11-07 ENCOUNTER — Emergency Department (HOSPITAL_COMMUNITY)
Admission: EM | Admit: 2020-11-07 | Discharge: 2020-11-07 | Disposition: A | Discharge status: Home / Self Care | Payer: Managed Care, Other (non HMO) | Attending: Emergency Medicine | Admitting: Emergency Medicine

## 2020-11-07 ENCOUNTER — Other Ambulatory Visit: Payer: Self-pay

## 2020-11-07 DIAGNOSIS — J029 Acute pharyngitis, unspecified: Secondary | ICD-10-CM

## 2020-11-07 DIAGNOSIS — R070 Pain in throat: Secondary | ICD-10-CM | POA: Diagnosis present

## 2020-11-07 DIAGNOSIS — Z87891 Personal history of nicotine dependence: Secondary | ICD-10-CM | POA: Insufficient documentation

## 2020-11-07 DIAGNOSIS — Z20822 Contact with and (suspected) exposure to covid-19: Secondary | ICD-10-CM | POA: Diagnosis not present

## 2020-11-07 LAB — RESPIRATORY PANEL BY RT PCR (FLU A&B, COVID)
Influenza A by PCR: NEGATIVE
Influenza B by PCR: NEGATIVE
SARS Coronavirus 2 by RT PCR: NEGATIVE

## 2020-11-07 LAB — GROUP A STREP BY PCR: Group A Strep by PCR: NOT DETECTED

## 2020-11-07 MED ORDER — CEPHALEXIN 500 MG PO CAPS: 500.0000 mg | ORAL_CAPSULE | Freq: Three times a day (TID) | ORAL | 0 refills | Status: AC

## 2020-11-07 MED ORDER — PREDNISONE 10 MG PO TABS
20.0000 mg | ORAL_TABLET | Freq: Two times a day (BID) | ORAL | 0 refills | Status: AC
Start: 2020-11-07 — End: ?

## 2020-11-07 MED ORDER — OMEPRAZOLE 20 MG PO CPDR
20.0000 mg | DELAYED_RELEASE_CAPSULE | Freq: Two times a day (BID) | ORAL | 0 refills | Status: AC
Start: 2020-11-07 — End: ?

## 2020-11-07 NOTE — ED Provider Notes (Signed)
[facility]-EMERGENCY DEPT Provider Note   CSN: [id number] Arrival date & time: 11/07/20  0247     History Chief Complaint  Patient presents with  . Sore Throat    Paula Ray is a 44 y.o. female.  Patient is a 44 year old female with no significant past medical history.  She presents today for evaluation of throat pain.  Patient states that this was present when she woke up this morning.  She describes a burning to the back of her throat that is worse when she swallows, drinks, or eats.  She denies any fevers or chills.  She denies any sick contacts.  She denies a history of reflux.  She did try taking Tums, however this did not seem to help.  The history is provided by the patient.  Sore Throat This is a new problem. The current episode started 12 to 24 hours ago. The problem occurs constantly. The problem has been rapidly worsening. The symptoms are aggravated by swallowing. Nothing relieves the symptoms. She has tried nothing for the symptoms.       History reviewed. No pertinent past medical history.  There are no problems to display for this patient.   Past Surgical History:  Procedure Laterality Date  . CESAREAN SECTION       OB History   No obstetric history on file.     Family History  Problem Relation Age of Onset  . Hypertension Mother     Social History   Tobacco Use  . Smoking status: Former Smoker    Years: 20.00    Quit date: 05/30/2018    Years since quitting: 2.4  . Smokeless tobacco: Never Used  Vaping Use  . Vaping Use: Never used  Substance Use Topics  . Alcohol use: Yes    Comment: occasionally   . Drug use: Yes    Types: Marijuana    Home Medications Prior to Admission medications   Medication Sig Start Date End Date Taking? Authorizing Provider  acetaminophen (TYLENOL) 325 MG tablet Take 650 mg by mouth every 6 (six) hours as needed for mild pain or headache.   Yes [provider]  calcium carbonate  (TUMS EX) 750 MG chewable tablet Chew 2 tablets by mouth daily as needed for heartburn.   Yes [provider]  Pseudoephedrine-APAP-DM (DAYQUIL PO) Take 30 mLs by mouth once as needed.   Yes [provider]    Allergies    Fexofenadine hcl and Zyrtec [cetirizine hcl]  Review of Systems   Review of Systems  All other systems reviewed and are negative.   Physical Exam Updated Vital Signs BP (!) 192/134 (BP Location: Left Arm) Comment: RN is aware of BP  Pulse 89   Temp 98.2 F (36.8 C) (Oral)   Resp 20   Ht 5' (1.524 m)   Wt 120 kg   SpO2 99%   BMI 51.67 kg/m   Physical Exam Vitals and nursing note reviewed.  Constitutional:      General: She is not in acute distress.    Appearance: She is well-developed. She is not diaphoretic.  HENT:     Head: Normocephalic and atraumatic.     Mouth/Throat:     Mouth: Mucous membranes are moist.     Pharynx: Posterior oropharyngeal erythema present. No pharyngeal swelling, oropharyngeal exudate or uvula swelling.  Cardiovascular:     Rate and Rhythm: Normal rate and regular rhythm.     Heart sounds: No murmur heard.  No  friction rub. No gallop.   Pulmonary:     Effort: Pulmonary effort is normal. No respiratory distress.     Breath sounds: Normal breath sounds. No wheezing.  Abdominal:     General: Bowel sounds are normal. There is no distension.     Palpations: Abdomen is soft.     Tenderness: There is no abdominal tenderness.  Musculoskeletal:        General: Normal range of motion.     Cervical back: Normal range of motion and neck supple.  Skin:    General: Skin is warm and dry.  Neurological:     Mental Status: She is alert and oriented to person, place, and time.     ED Results / Procedures / Treatments   Labs (all labs ordered are listed, but only abnormal results are displayed) Labs Reviewed  RESPIRATORY PANEL BY RT PCR (FLU A&B, COVID)    EKG None  Radiology No results  found.  Procedures Procedures (including critical care time)  Medications Ordered in ED Medications - No data to display  ED Course  I have reviewed the triage vital signs and the nursing notes.  Pertinent labs & imaging results that were available during my care of the patient were reviewed by me and considered in my medical decision making (see chart for details).    MDM Rules/Calculators/A&P  Patient presenting here with complaints of burning in her throat.  This was present upon waking this morning.  I see no significant erythema or exudates on exam and there is no stridor.  She is handling her secretions without difficulty.  Covid and strep test are both negative.  I am uncertain as to whether this is an infectious process or possibly acid reflux.  I will treat both ways with an antacid, antibiotic, and steroid.  Patient is to follow-up with primary doctor if not improving and return to the ER if she worsens.  Final Clinical Impression(s) / ED Diagnoses Final diagnoses:  None    Rx / DC Orders ED Discharge Orders    None       Geoffery Lyons, MD 11/07/20 [phone]

## 2020-11-07 NOTE — Discharge Instructions (Addendum)
Begin taking prednisone and Keflex as prescribed.  Begin taking omeprazole as prescribed.  Follow-up with your primary doctor if symptoms or not improving in the next 3 to 4 days, and return to the ER if you develop difficulty breathing, inability to swallow, worsening pain, or other new and concerning symptoms.

## 2020-11-07 NOTE — ED Triage Notes (Signed)
Pt reports sore throat that began today when waking up.

## 2020-12-06 ENCOUNTER — Other Ambulatory Visit: Payer: Self-pay

## 2020-12-06 ENCOUNTER — Encounter (HOSPITAL_COMMUNITY): Payer: Self-pay | Admitting: *Deleted

## 2020-12-06 ENCOUNTER — Emergency Department (HOSPITAL_COMMUNITY)
Admission: EM | Admit: 2020-12-06 | Discharge: 2020-12-06 | Disposition: A | Discharge status: Home / Self Care | Payer: Managed Care, Other (non HMO) | Attending: Emergency Medicine | Admitting: Emergency Medicine

## 2020-12-06 DIAGNOSIS — R42 Dizziness and giddiness: Secondary | ICD-10-CM | POA: Insufficient documentation

## 2020-12-06 DIAGNOSIS — Z87891 Personal history of nicotine dependence: Secondary | ICD-10-CM | POA: Diagnosis not present

## 2020-12-06 DIAGNOSIS — R110 Nausea: Secondary | ICD-10-CM | POA: Diagnosis not present

## 2020-12-06 MED ORDER — ONDANSETRON HCL 4 MG PO TABS: 4.0000 mg | ORAL_TABLET | Freq: Four times a day (QID) | ORAL | 0 refills | Status: AC

## 2020-12-06 MED ORDER — ONDANSETRON HCL 4 MG PO TABS
4.0000 mg | ORAL_TABLET | Freq: Once | ORAL | Status: AC
  Administered 2020-12-06: 4 mg via ORAL
  Filled 2020-12-06: qty 1

## 2020-12-06 MED ORDER — MECLIZINE HCL 25 MG PO TABS
25.0000 mg | ORAL_TABLET | Freq: Once | ORAL | Status: AC
  Administered 2020-12-06: 25 mg via ORAL
  Filled 2020-12-06: qty 1

## 2020-12-06 MED ORDER — CLONIDINE HCL 0.1 MG PO TABS
0.1000 mg | ORAL_TABLET | Freq: Once | ORAL | Status: AC
  Administered 2020-12-06: 0.1 mg via ORAL
  Filled 2020-12-06: qty 1

## 2020-12-06 MED ORDER — MECLIZINE HCL 25 MG PO TABS
25.0000 mg | ORAL_TABLET | Freq: Three times a day (TID) | ORAL | 0 refills | Status: AC | PRN
Start: 2020-12-06 — End: ?

## 2020-12-06 MED ORDER — ACETAMINOPHEN 500 MG PO TABS
1000.0000 mg | ORAL_TABLET | Freq: Once | ORAL | Status: AC
  Administered 2020-12-06: 1000 mg via ORAL
  Filled 2020-12-06: qty 2

## 2020-12-06 NOTE — Discharge Instructions (Signed)
Please return for any problem.  °

## 2020-12-06 NOTE — ED Provider Notes (Signed)
[facility]-EMERGENCY DEPT Provider Note   CSN: [id number] Arrival date & time: 12/06/20  1456     History Chief Complaint  Patient presents with  . Headache  . Dizziness    Paula Ray is a 44 y.o. female.  44 year old female with prior medical history as detailed below presents for evaluation.  Patient reports prior history of vertigo.  Patient reports onset of vertiginous symptoms over the last 24 hours.  She did not take anything for her symptoms at home.  She reports mild associated nausea.  She denies fever.  She denies focal weakness.  She denies associated chest pain or shortness of breath.  Patient requests Antivert and antinausea medication.  The history is provided by the patient and medical records.  Dizziness Quality:  Vertigo Severity:  Mild Onset quality:  Gradual Duration:  2 days Timing:  Constant Progression:  Unchanged Chronicity:  New Relieved by:  Nothing Worsened by:  Nothing      Past Medical History:  Diagnosis Date  . Vertigo     There are no problems to display for this patient.   Past Surgical History:  Procedure Laterality Date  . CESAREAN SECTION       OB History   No obstetric history on file.     Family History  Problem Relation Age of Onset  . Hypertension Mother     Social History   Tobacco Use  . Smoking status: Former Smoker    Years: 20.00    Quit date: 05/30/2018    Years since quitting: 2.5  . Smokeless tobacco: Never Used  Vaping Use  . Vaping Use: Never used  Substance Use Topics  . Alcohol use: Yes    Comment: occasionally   . Drug use: Yes    Types: Marijuana    Home Medications Prior to Admission medications   Medication Sig Start Date End Date Taking? Authorizing Provider  acetaminophen (TYLENOL) 325 MG tablet Take 650 mg by mouth every 6 (six) hours as needed for mild pain or headache.    [provider]  calcium carbonate (TUMS EX) 750 MG chewable tablet Chew 2  tablets by mouth daily as needed for heartburn.    [provider]  cephALEXin (KEFLEX) 500 MG capsule Take 1 capsule (500 mg total) by mouth 3 (three) times daily. 11/07/20   Geoffery Lyons, MD  omeprazole (PRILOSEC) 20 MG capsule Take 1 capsule (20 mg total) by mouth 2 (two) times daily before a meal. 11/07/20   Geoffery Lyons, MD  predniSONE (DELTASONE) 10 MG tablet Take 2 tablets (20 mg total) by mouth 2 (two) times daily with a meal. 11/07/20   Delo, Riley Lam, MD  Pseudoephedrine-APAP-DM (DAYQUIL PO) Take 30 mLs by mouth once as needed.    [provider]    Allergies    Fexofenadine hcl and Zyrtec [cetirizine hcl]  Review of Systems   Review of Systems  Neurological: Positive for dizziness.  All other systems reviewed and are negative.   Physical Exam Updated Vital Signs BP (!) 179/124 (BP Location: Right Arm)   Pulse 80   Temp 98.1 F (36.7 C) (Oral)   Resp 16   Ht 5\' 3"  (1.6 m)   Wt 117.9 kg   SpO2 100%   BMI 46.06 kg/m   Physical Exam Vitals and nursing note reviewed.  Constitutional:      General: She is not in acute distress.    Appearance: She is well-developed and well-nourished.  HENT:  Head: Normocephalic and atraumatic.     Mouth/Throat:     Mouth: Oropharynx is clear and moist.  Eyes:     General: No visual field deficit.    Extraocular Movements: EOM normal.     Conjunctiva/sclera: Conjunctivae normal.     Pupils: Pupils are equal, round, and reactive to light.  Cardiovascular:     Rate and Rhythm: Normal rate and regular rhythm.     Heart sounds: Normal heart sounds.  Pulmonary:     Effort: Pulmonary effort is normal. No respiratory distress.     Breath sounds: Normal breath sounds.  Abdominal:     General: There is no distension.     Palpations: Abdomen is soft.     Tenderness: There is no abdominal tenderness.  Musculoskeletal:        General: No deformity or edema. Normal range of motion.     Cervical back: Normal range of  motion and neck supple.  Skin:    General: Skin is warm and dry.  Neurological:     Mental Status: She is alert and oriented to person, place, and time. Mental status is at baseline.     GCS: GCS eye subscore is 4. GCS verbal subscore is 5. GCS motor subscore is 6.     Cranial Nerves: No cranial nerve deficit, dysarthria or facial asymmetry.     Sensory: No sensory deficit.     Motor: No weakness.     Coordination: Romberg sign negative. Coordination normal.  Psychiatric:        Mood and Affect: Mood and affect normal.     ED Results / Procedures / Treatments   Labs (all labs ordered are listed, but only abnormal results are displayed) Labs Reviewed - No data to display  EKG None  Radiology No results found.  Procedures Procedures (including critical care time)  Medications Ordered in ED Medications  meclizine (ANTIVERT) tablet 25 mg (has no administration in time range)  ondansetron (ZOFRAN) tablet 4 mg (has no administration in time range)  acetaminophen (TYLENOL) tablet 1,000 mg (has no administration in time range)    ED Course  I have reviewed the triage vital signs and the nursing notes.  Pertinent labs & imaging results that were available during my care of the patient were reviewed by me and considered in my medical decision making (see chart for details).    MDM Rules/Calculators/A&P                          MDM  Screen complete  [name] was evaluated in Emergency Department on 12/06/2020 for the symptoms described in the history of present illness. She was evaluated in the context of the global COVID-19 pandemic, which necessitated consideration that the patient might be at risk for infection with the SARS-CoV-2 virus that causes COVID-19. Institutional protocols and algorithms that pertain to the evaluation of patients at risk for COVID-19 are in a state of rapid change based on information released by regulatory bodies including the CDC and federal  and state organizations. These policies and algorithms were followed during the patient's care in the ED.  Patient presented for evaluation of reported vertiginous symptoms.  With administration of meclizine and Zofran patient felt significantly improved.  Patient's exam was not suggestive of other significant acute pathology.  Patient is advised to closely follow-up with her regular care provider.  Strict return precautions given and understood.   Final Clinical Impression(s) / ED  Diagnoses Final diagnoses:  Vertigo    Rx / DC Orders ED Discharge Orders         Ordered    meclizine (ANTIVERT) 25 MG tablet  3 times daily PRN        12/06/20 2256    ondansetron (ZOFRAN) 4 MG tablet  Every 6 hours        12/06/20 2256           Wynetta Fines, MD 12/06/20 2303

## 2020-12-06 NOTE — ED Triage Notes (Signed)
Headache yesterday, this morning ? Vertigo which she has a history of. Nausea and off balance due to dizziness.

## 2021-05-13 IMAGING — CR DG CHEST 2V
2 series · 2 of 2 positions shown · non-contrast
Comparison: 07/15/2016

CLINICAL DATA: Shortness of breath

EXAM:
CHEST - 2 VIEW

[w chest pa]
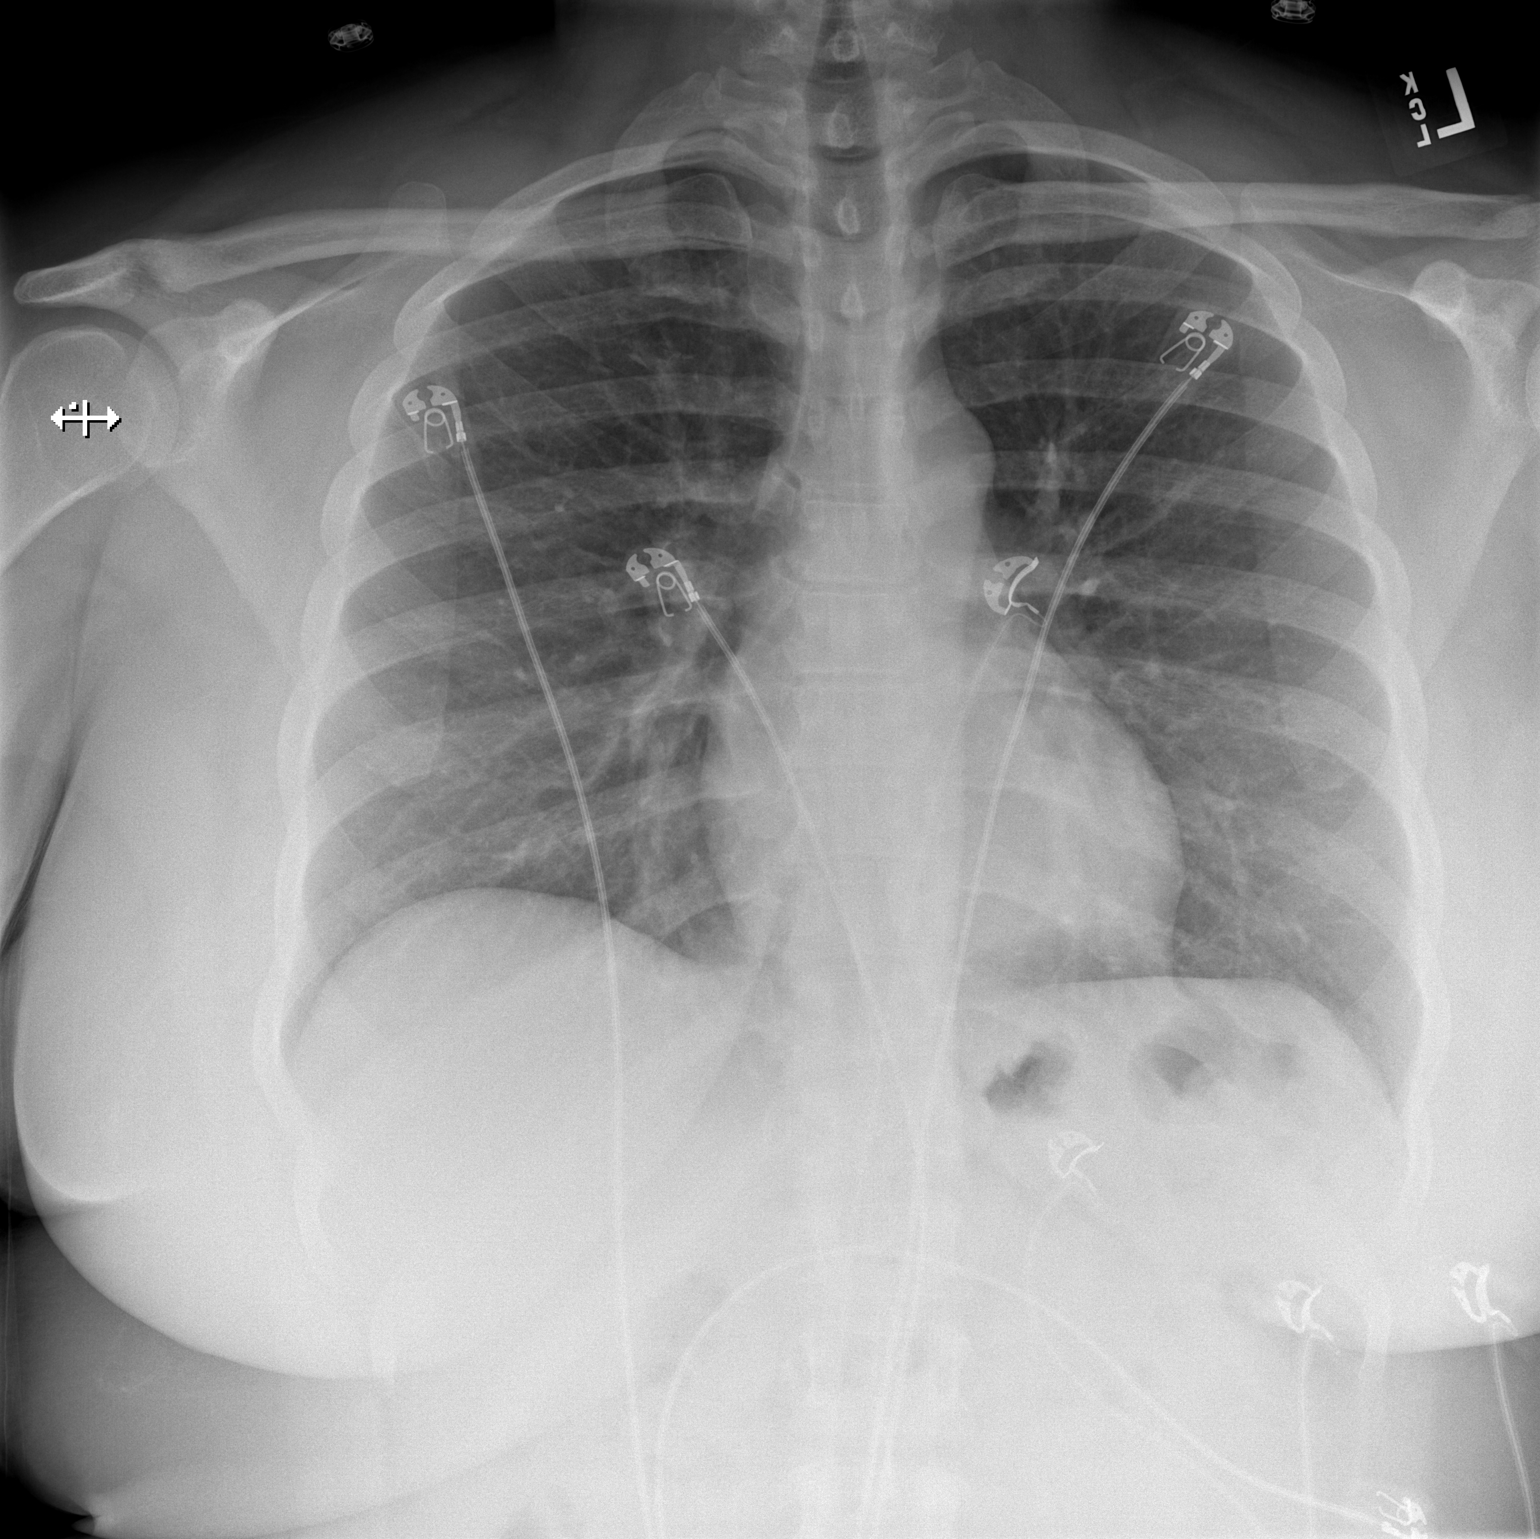

[w chest lat]
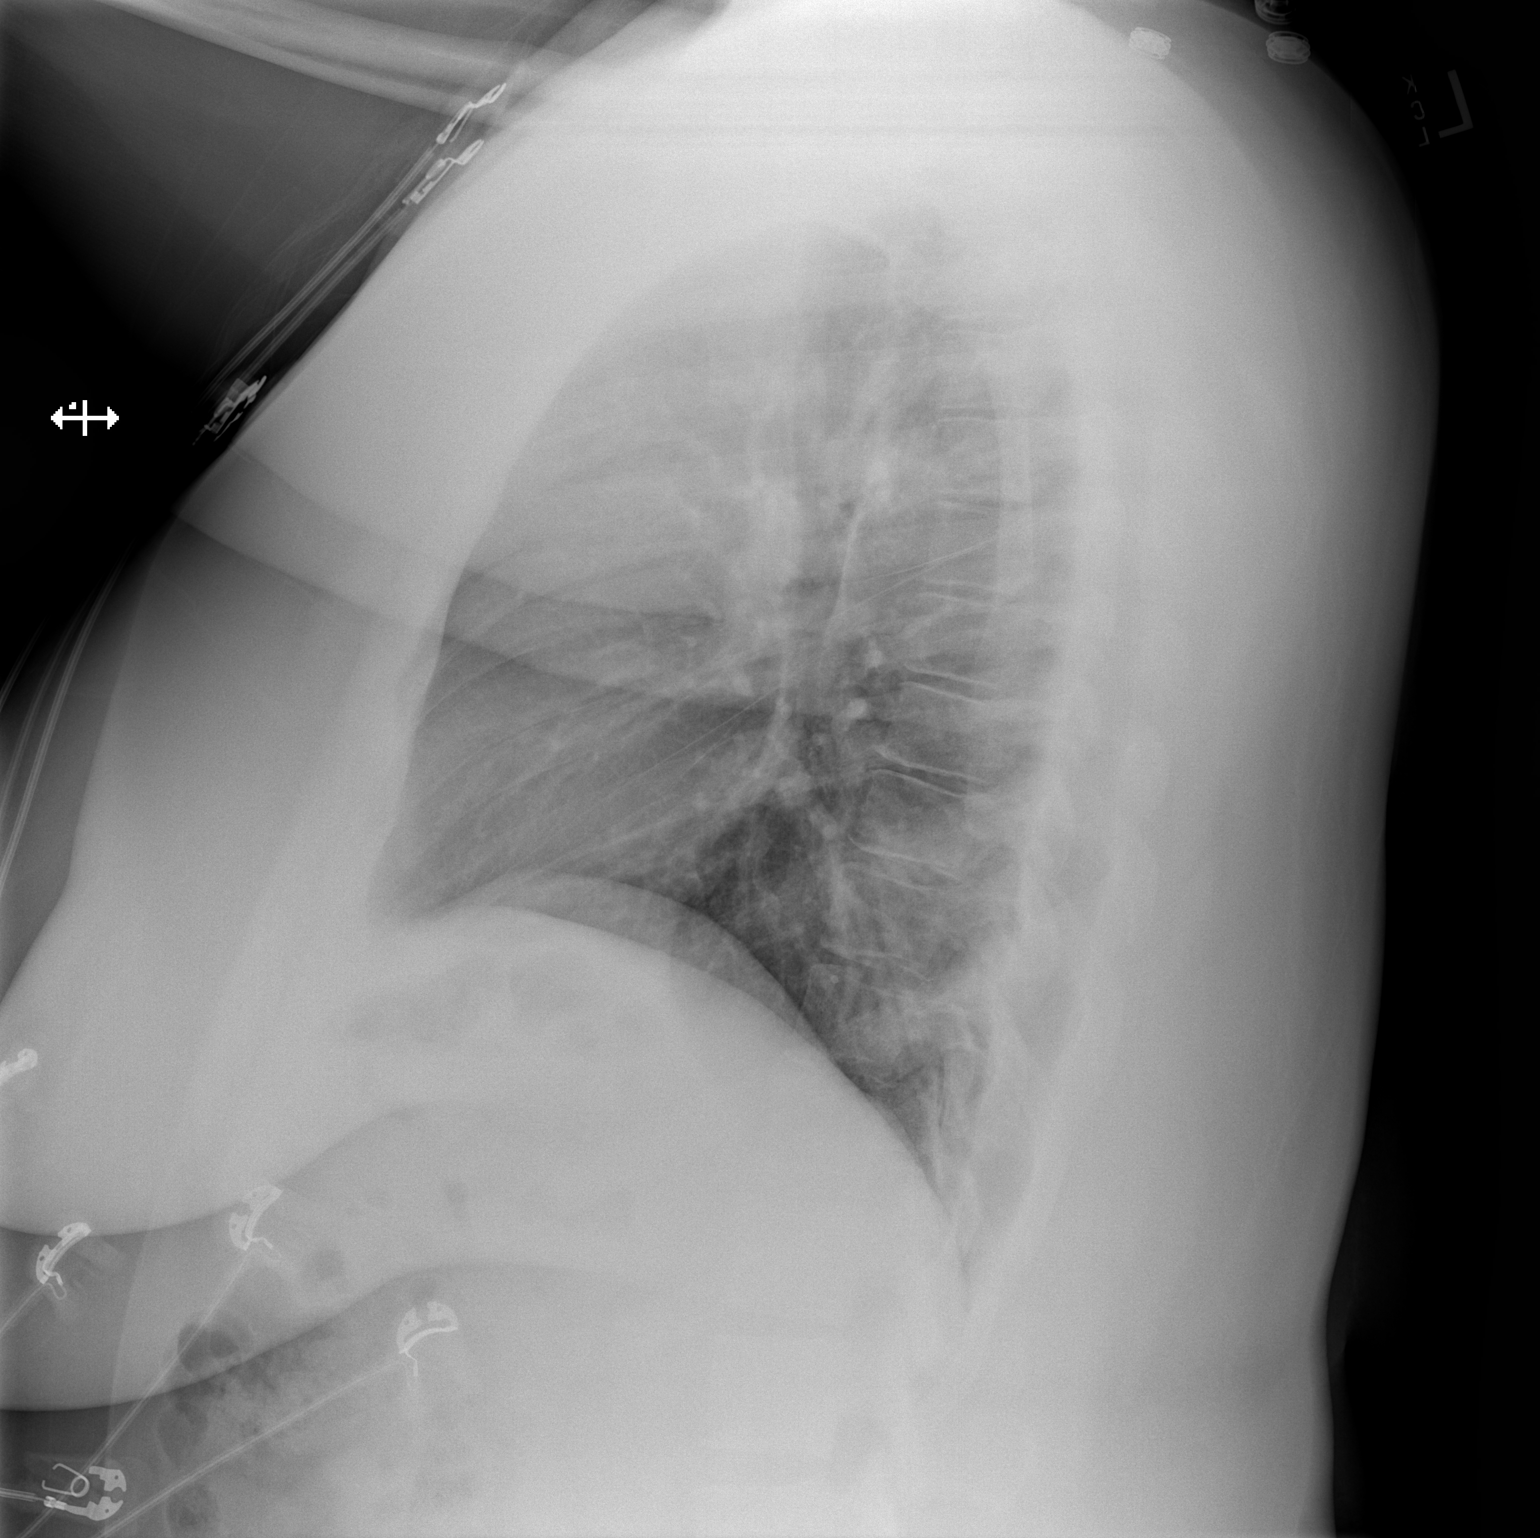

[2 of 2 positions shown; findings below may reference images not displayed]

FINDINGS: The heart size and mediastinal contours are within normal limits.
Both lungs are clear. The visualized skeletal structures are
unremarkable.
IMPRESSION: No acute abnormality of the lungs.

## 2021-10-18 IMAGING — CR DG CHEST 2V
2 series · 2 of 2 positions shown · non-contrast
Comparison: Prior chest radiograph 06/15/2020 and earlier

CLINICAL DATA: Chest pain.

EXAM:
CHEST - 2 VIEW

[w chest pa]
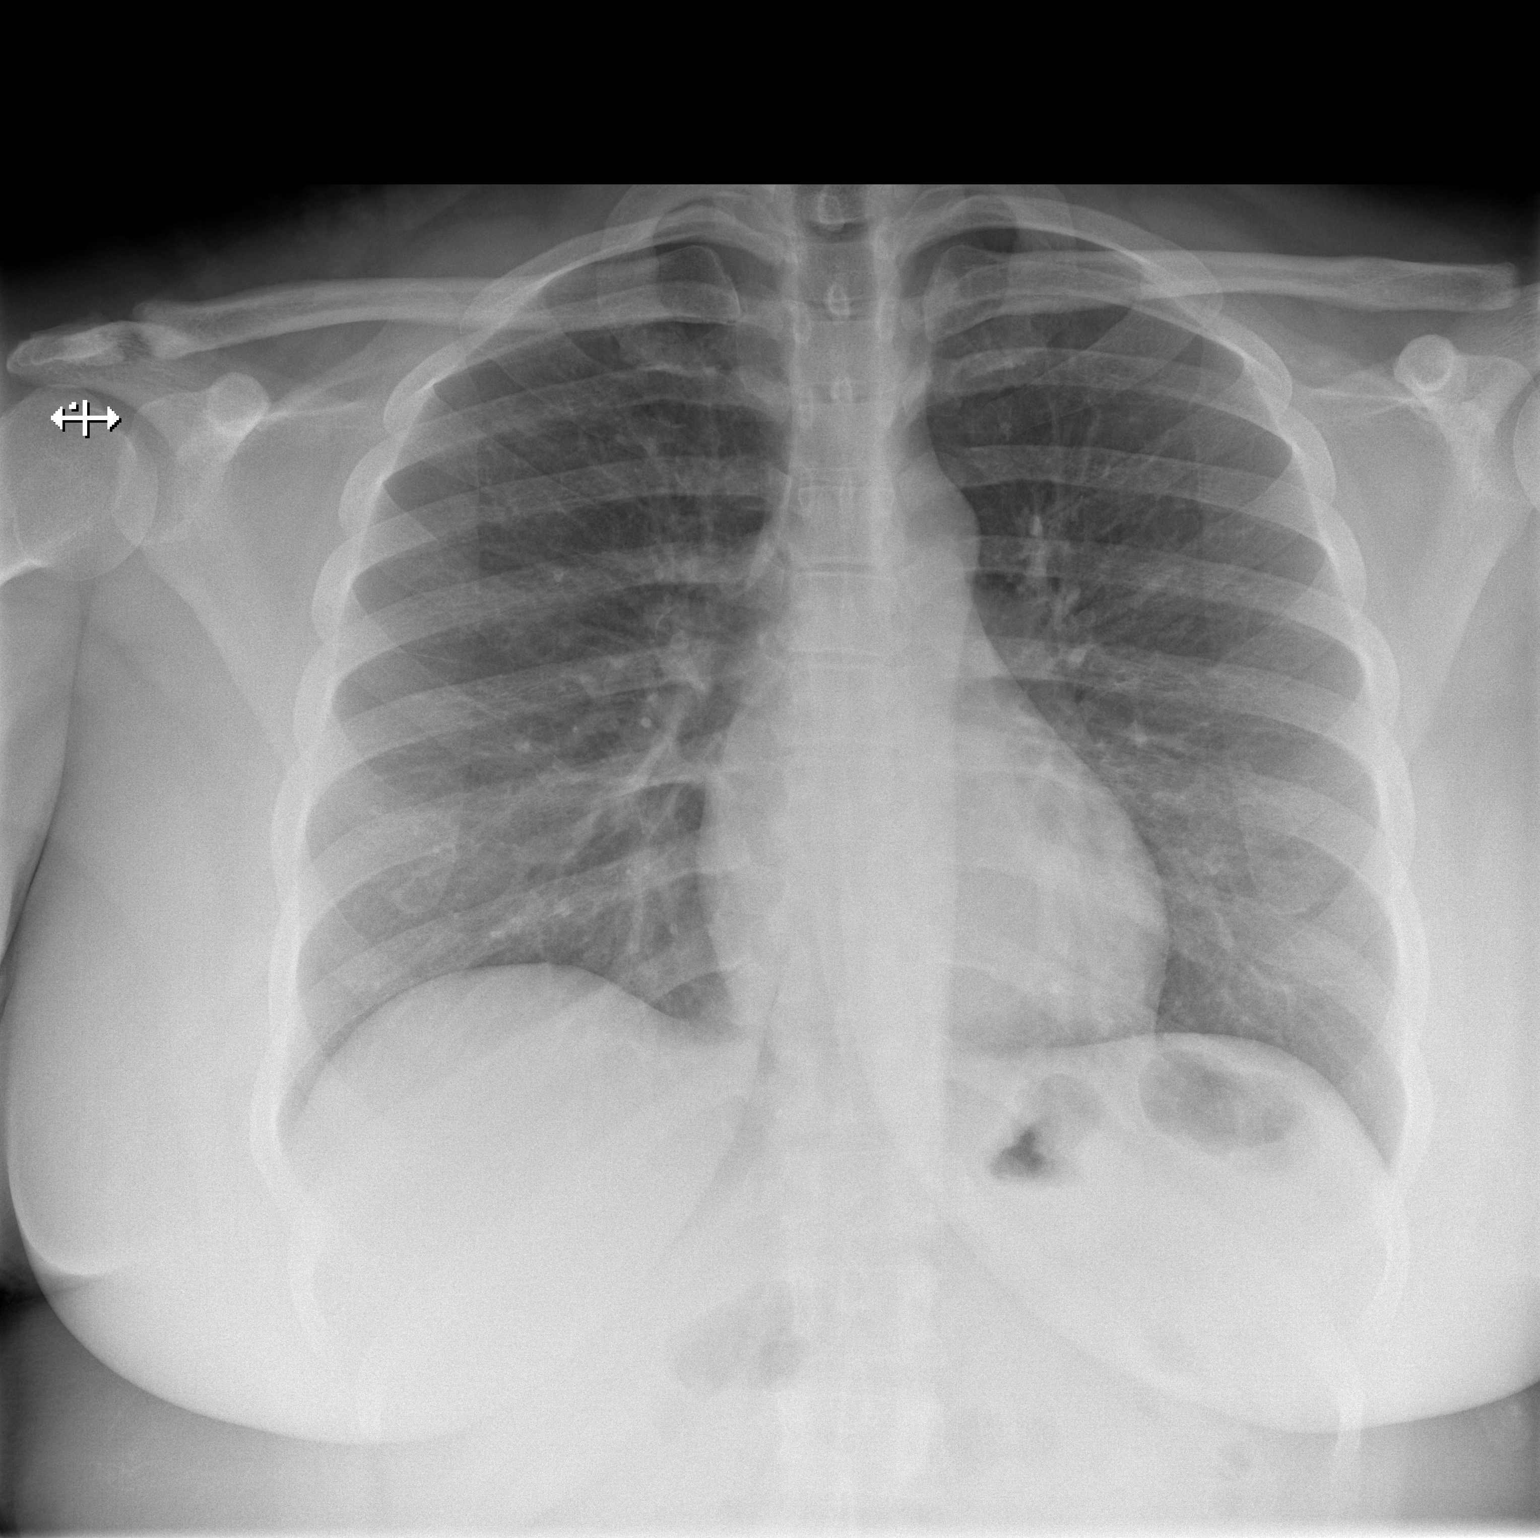

[w chest lat]
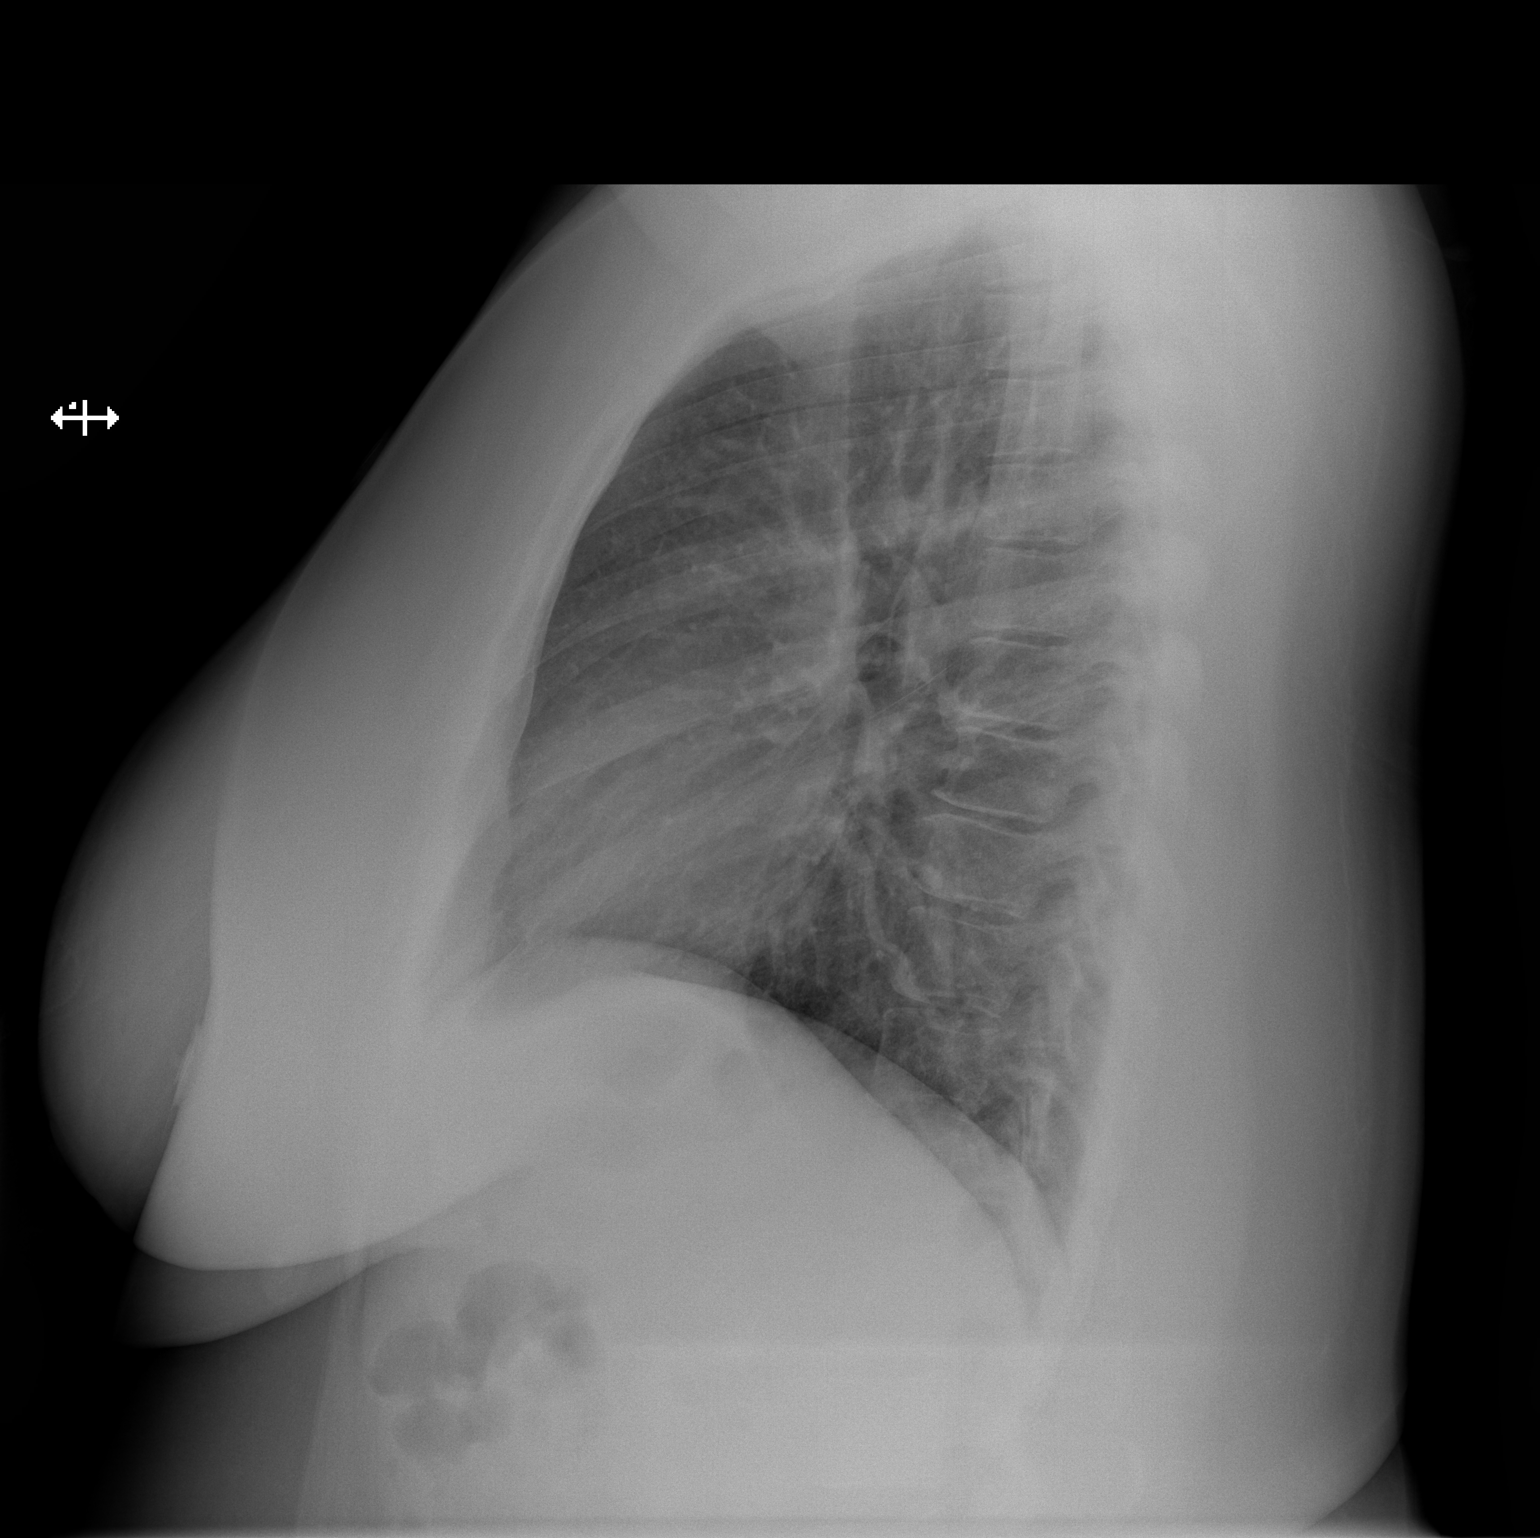

[2 of 2 positions shown; findings below may reference images not displayed]

FINDINGS: Heart size within normal limits.

There is no appreciable airspace consolidation.

No evidence of pleural effusion or pneumothorax.

No acute bony abnormality identified.
IMPRESSION: No evidence of acute cardiopulmonary abnormality.

## 2021-11-01 IMAGING — CR DG CHEST 2V
2 series · 2 of 2 positions shown · non-contrast
Comparison: June 22, 2020.

CLINICAL DATA: Chest pain.

EXAM:
CHEST - 2 VIEW

[w chest pa]
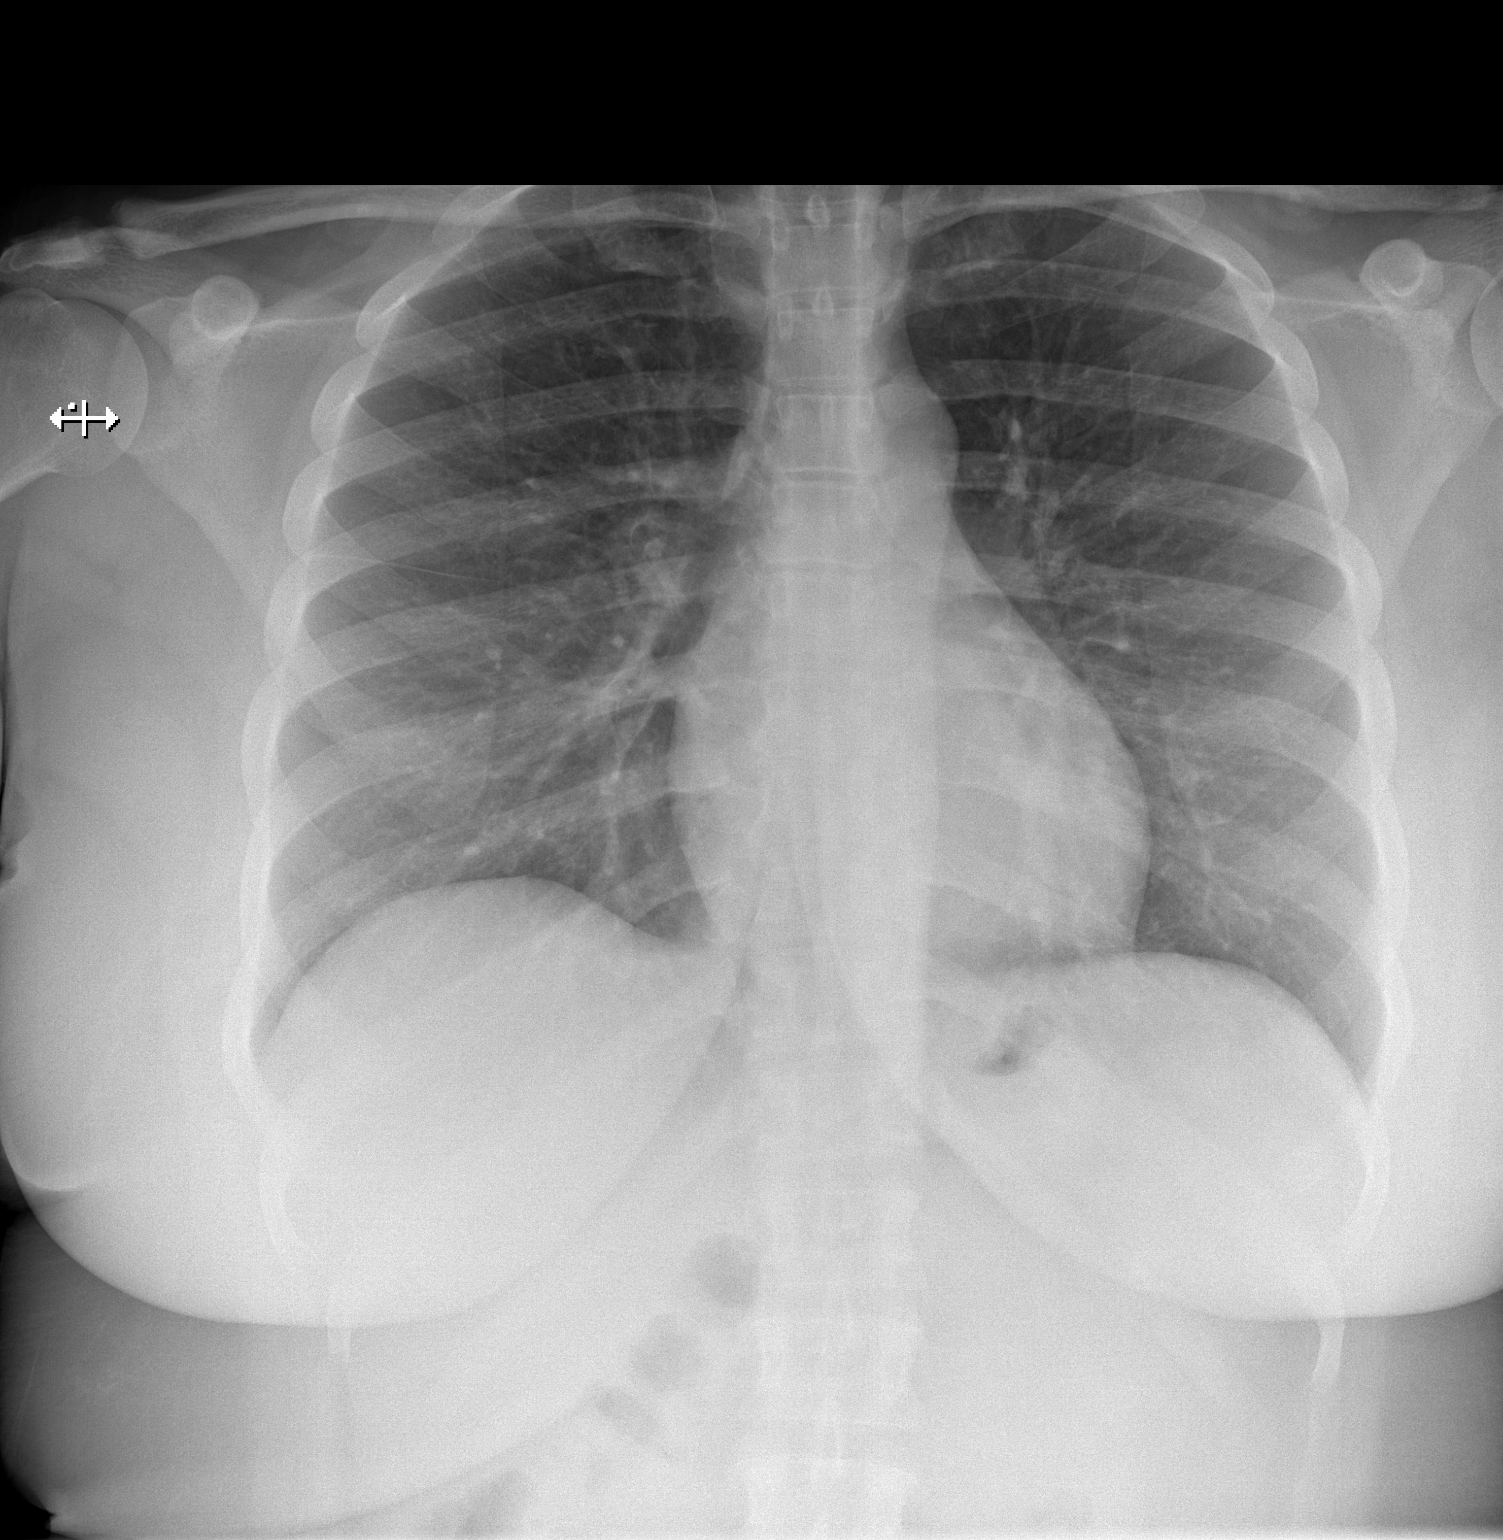

[w chest lat]
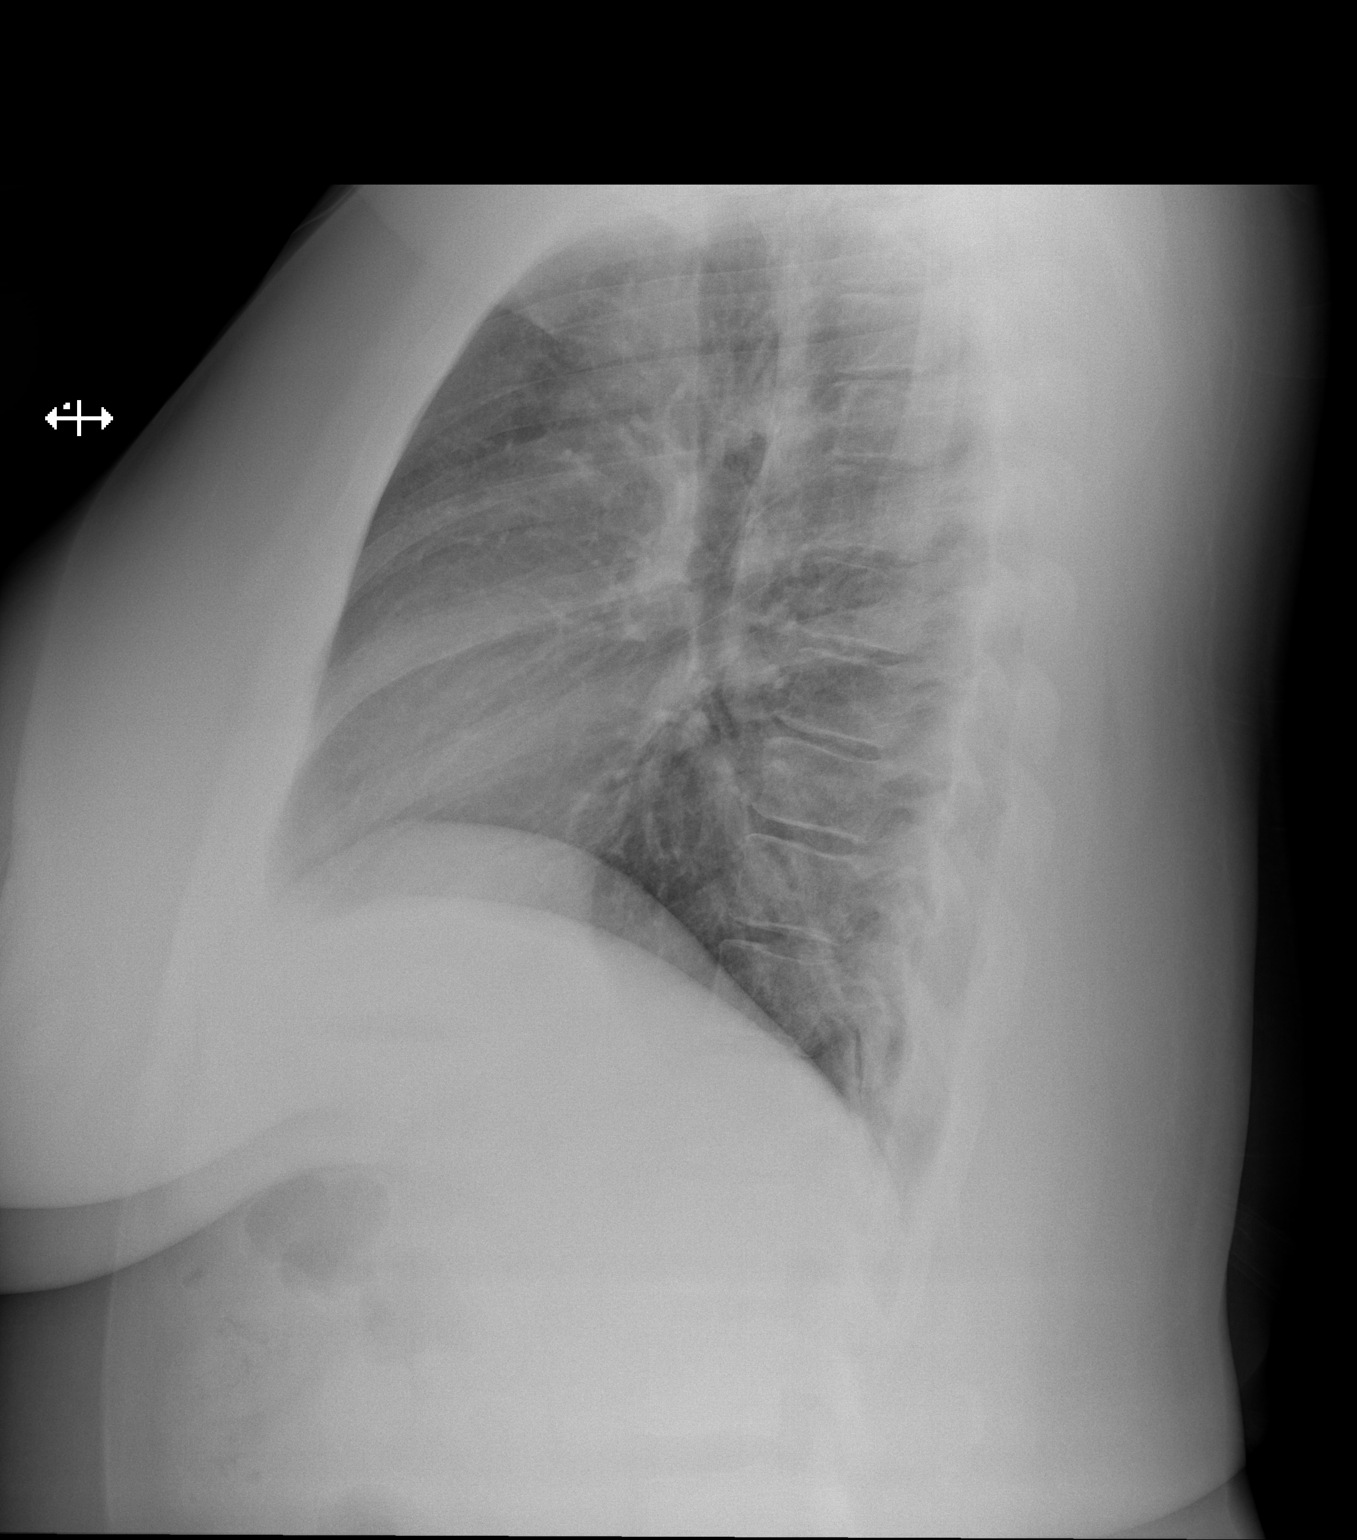

[2 of 2 positions shown; findings below may reference images not displayed]

FINDINGS: The heart size and mediastinal contours are within normal limits.
Both lungs are clear. No pneumothorax or pleural effusion is noted.
The visualized skeletal structures are unremarkable.
IMPRESSION: No active cardiopulmonary disease.

## 2021-11-29 ENCOUNTER — Encounter (HOSPITAL_COMMUNITY): Payer: Self-pay

## 2021-11-29 ENCOUNTER — Emergency Department (HOSPITAL_BASED_OUTPATIENT_CLINIC_OR_DEPARTMENT_OTHER): Payer: Managed Care, Other (non HMO)

## 2021-11-29 ENCOUNTER — Emergency Department (HOSPITAL_COMMUNITY)
Admission: EM | Admit: 2021-11-29 | Discharge: 2021-11-29 | Disposition: A | Discharge status: Home / Self Care | Payer: Managed Care, Other (non HMO) | Attending: Emergency Medicine | Admitting: Emergency Medicine

## 2021-11-29 ENCOUNTER — Other Ambulatory Visit: Payer: Self-pay

## 2021-11-29 ENCOUNTER — Emergency Department (HOSPITAL_COMMUNITY): Payer: Managed Care, Other (non HMO)

## 2021-11-29 DIAGNOSIS — R0789 Other chest pain: Secondary | ICD-10-CM | POA: Diagnosis not present

## 2021-11-29 DIAGNOSIS — Z87891 Personal history of nicotine dependence: Secondary | ICD-10-CM | POA: Insufficient documentation

## 2021-11-29 DIAGNOSIS — R42 Dizziness and giddiness: Secondary | ICD-10-CM | POA: Insufficient documentation

## 2021-11-29 DIAGNOSIS — M5431 Sciatica, right side: Secondary | ICD-10-CM

## 2021-11-29 DIAGNOSIS — R030 Elevated blood-pressure reading, without diagnosis of hypertension: Secondary | ICD-10-CM | POA: Diagnosis not present

## 2021-11-29 DIAGNOSIS — N9489 Other specified conditions associated with female genital organs and menstrual cycle: Secondary | ICD-10-CM | POA: Insufficient documentation

## 2021-11-29 DIAGNOSIS — M549 Dorsalgia, unspecified: Secondary | ICD-10-CM | POA: Diagnosis present

## 2021-11-29 DIAGNOSIS — M79661 Pain in right lower leg: Secondary | ICD-10-CM

## 2021-11-29 DIAGNOSIS — M5441 Lumbago with sciatica, right side: Secondary | ICD-10-CM | POA: Diagnosis not present

## 2021-11-29 DIAGNOSIS — M79604 Pain in right leg: Secondary | ICD-10-CM | POA: Diagnosis not present

## 2021-11-29 DIAGNOSIS — M545 Low back pain, unspecified: Secondary | ICD-10-CM

## 2021-11-29 LAB — URINALYSIS, ROUTINE W REFLEX MICROSCOPIC
Bilirubin Urine: NEGATIVE
Glucose, UA: NEGATIVE mg/dL
Ketones, ur: 5 mg/dL — AB
Leukocytes,Ua: NEGATIVE
Nitrite: NEGATIVE
Protein, ur: NEGATIVE mg/dL
Specific Gravity, Urine: 1.021 (ref 1.005–1.030)
pH: 5 (ref 5.0–8.0)

## 2021-11-29 LAB — CBC WITH DIFFERENTIAL/PLATELET
Abs Immature Granulocytes: 0.02 10*3/uL (ref 0.00–0.07)
Basophils Absolute: 0 10*3/uL (ref 0.0–0.1)
Basophils Relative: 0 %
Eosinophils Absolute: 0.1 10*3/uL (ref 0.0–0.5)
Eosinophils Relative: 1 %
HCT: 40.9 % (ref 36.0–46.0)
Hemoglobin: 13.4 g/dL (ref 12.0–15.0)
Immature Granulocytes: 0 %
Lymphocytes Relative: 27 %
Lymphs Abs: 1.8 10*3/uL (ref 0.7–4.0)
MCH: 29.8 pg (ref 26.0–34.0)
MCHC: 32.8 g/dL (ref 30.0–36.0)
MCV: 91.1 fL (ref 80.0–100.0)
Monocytes Absolute: 0.5 10*3/uL (ref 0.1–1.0)
Monocytes Relative: 8 %
Neutro Abs: 4.4 10*3/uL (ref 1.7–7.7)
Neutrophils Relative %: 64 %
Platelets: 436 10*3/uL — ABNORMAL HIGH (ref 150–400)
RBC: 4.49 MIL/uL (ref 3.87–5.11)
RDW: 12.2 % (ref 11.5–15.5)
WBC: 6.8 10*3/uL (ref 4.0–10.5)
nRBC: 0 % (ref 0.0–0.2)

## 2021-11-29 LAB — COMPREHENSIVE METABOLIC PANEL
ALT: 12 U/L (ref 0–44)
AST: 14 U/L — ABNORMAL LOW (ref 15–41)
Albumin: 4.3 g/dL (ref 3.5–5.0)
Alkaline Phosphatase: 62 U/L (ref 38–126)
Anion gap: 6 (ref 5–15)
BUN: 10 mg/dL (ref 6–20)
CO2: 21 mmol/L — ABNORMAL LOW (ref 22–32)
Calcium: 8.7 mg/dL — ABNORMAL LOW (ref 8.9–10.3)
Chloride: 109 mmol/L (ref 98–111)
Creatinine, Ser: 0.67 mg/dL (ref 0.44–1.00)
GFR, Estimated: >60 mL/min (ref >60)
Glucose, Bld: 94 mg/dL (ref 70–99)
Potassium: 3.6 mmol/L (ref 3.5–5.1)
Sodium: 136 mmol/L (ref 135–145)
Total Bilirubin: 1 mg/dL (ref 0.3–1.2)
Total Protein: 7.7 g/dL (ref 6.5–8.1)

## 2021-11-29 LAB — LIPASE, BLOOD: Lipase: 28 U/L (ref 11–51)

## 2021-11-29 LAB — I-STAT BETA HCG BLOOD, ED (MC, WL, AP ONLY): I-stat hCG, quantitative: <5 m[IU]/mL (ref <5)

## 2021-11-29 LAB — TROPONIN I (HIGH SENSITIVITY): Troponin I (High Sensitivity): 3 ng/L (ref <18)

## 2021-11-29 MED ORDER — NAPROXEN 500 MG PO TABS: 500.0000 mg | ORAL_TABLET | Freq: Two times a day (BID) | ORAL | 0 refills | Status: AC

## 2021-11-29 MED ORDER — MECLIZINE HCL 25 MG PO TABS: 25.0000 mg | ORAL_TABLET | Freq: Once | ORAL | Status: DC

## 2021-11-29 NOTE — ED Triage Notes (Signed)
Patient c/o having  fluttering in her left chest area 3 days ago. Patient continues to c/o intermittent chest pain in the lateral chest area x 1 week.  Patient also c/o right lower back pain that radiates into the right leg x 1 week  Patient also c/o abdominal cramping x 1 week.  Patient states she has had a history of vertigo and has also been c/o dizziness.  Patient has multiple complaints.

## 2021-11-29 NOTE — ED Provider Notes (Signed)
[facility] DEPT Provider Note   CSN: [id number] Arrival date & time: 11/29/21  1000     History Chief Complaint  Patient presents with   Chest Pain   Dizziness    Paula Ray is a 45 y.o. female.  HPI    45 year old female comes in with chief complaint of leg pain. She had arrived to the ER several hours ago, at that time she was having some chest discomfort and dizziness, but now she just wants to go home.  She is not having any chest discomfort or dizziness at this time and does not want to focus on this, she wants to instead focus on her primary complaint which is leg pain and back pain.  Patient reports that she started noticing some pain in the back on the right side for the last week or so.  The pain will often radiate down to her right calf area.  The pain is intermittent, with no specific evoking, aggravating or relieving factors.  She denies any trauma.  Pt has no hx of PE, DVT and denies any exogenous hormone (testosterone / estrogen) use, long distance travels or surgery in the past 6 weeks, active cancer, recent immobilization.  Review of system is negative for any numbness, tingling, pleuritic chest pain, exertional shortness of breath.  Past Medical History:  Diagnosis Date   Vertigo     There are no problems to display for this patient.   Past Surgical History:  Procedure Laterality Date   CESAREAN SECTION       OB History   No obstetric history on file.     Family History  Problem Relation Age of Onset   Hypertension Mother     Social History   Tobacco Use   Smoking status: Former    Years: 20.00    Types: Cigarettes    Quit date: 05/30/2018    Years since quitting: 3.5   Smokeless tobacco: Never  Vaping Use   Vaping Use: Never used  Substance Use Topics   Alcohol use: Yes    Comment: occasionally    Drug use: Yes    Types: Marijuana    Home Medications Prior to Admission medications   Medication Sig  Start Date End Date Taking? Authorizing Provider  naproxen (NAPROSYN) 500 MG tablet Take 1 tablet (500 mg total) by mouth 2 (two) times daily. 11/29/21  Yes Varney Biles, MD  acetaminophen (TYLENOL) 325 MG tablet Take 650 mg by mouth every 6 (six) hours as needed for mild pain or headache. Patient not taking: Reported on 11/29/2021    [provider]  calcium carbonate (TUMS EX) 750 MG chewable tablet Chew 2 tablets by mouth daily as needed for heartburn. Patient not taking: Reported on 11/29/2021    [provider]  cephALEXin (KEFLEX) 500 MG capsule Take 1 capsule (500 mg total) by mouth 3 (three) times daily. Patient not taking: Reported on 11/29/2021 11/07/20   Veryl Speak, MD  meclizine (ANTIVERT) 25 MG tablet Take 1 tablet (25 mg total) by mouth 3 (three) times daily as needed for dizziness. Patient not taking: Reported on 11/29/2021 12/06/20   Valarie Merino, MD  omeprazole (PRILOSEC) 20 MG capsule Take 1 capsule (20 mg total) by mouth 2 (two) times daily before a meal. Patient not taking: Reported on 11/29/2021 11/07/20   Veryl Speak, MD  ondansetron (ZOFRAN) 4 MG tablet Take 1 tablet (4 mg total) by mouth every 6 (six) hours. Patient not taking: Reported  on 11/29/2021 12/06/20   Valarie Merino, MD  predniSONE (DELTASONE) 10 MG tablet Take 2 tablets (20 mg total) by mouth 2 (two) times daily with a meal. Patient not taking: Reported on 11/29/2021 11/07/20   Veryl Speak, MD    Allergies    Fexofenadine hcl and Zyrtec [cetirizine hcl]  Review of Systems   Review of Systems  Constitutional:  Positive for activity change.  Respiratory:  Negative for shortness of breath.   Cardiovascular:  Negative for chest pain.  Musculoskeletal:  Positive for myalgias.  Neurological:  Negative for numbness.   Physical Exam Updated Vital Signs BP (!) 166/91   Pulse 74   Temp 98.5 F (36.9 C) (Oral)   Resp 16   Ht 5\' 4"  (1.626 m)   Wt 120.2 kg   SpO2 100%   BMI 45.49  kg/m   Physical Exam Vitals and nursing note reviewed.  Constitutional:      Appearance: She is well-developed.  HENT:     Head: Atraumatic.  Cardiovascular:     Rate and Rhythm: Normal rate.  Pulmonary:     Effort: Pulmonary effort is normal.  Musculoskeletal:     Cervical back: Normal range of motion.     Right lower leg: Tenderness present. No edema.  Skin:    General: Skin is warm and dry.  Neurological:     Mental Status: She is alert and oriented to person, place, and time.    ED Results / Procedures / Treatments   Labs (all labs ordered are listed, but only abnormal results are displayed) Labs Reviewed  COMPREHENSIVE METABOLIC PANEL - Abnormal; Notable for the following components:      Result Value   CO2 21 (*)    Calcium 8.7 (*)    AST 14 (*)    All other components within normal limits  CBC WITH DIFFERENTIAL/PLATELET - Abnormal; Notable for the following components:   Platelets 436 (*)    All other components within normal limits  URINALYSIS, ROUTINE W REFLEX MICROSCOPIC - Abnormal; Notable for the following components:   APPearance HAZY (*)    Hgb urine dipstick SMALL (*)    Ketones, ur 5 (*)    Bacteria, UA RARE (*)    All other components within normal limits  LIPASE, BLOOD  I-STAT BETA HCG BLOOD, ED ([facility], [facility], [facility] ONLY)  TROPONIN I (HIGH SENSITIVITY)  TROPONIN I (HIGH SENSITIVITY)    EKG EKG Interpretation  Date/Time:  Monday November 29 2021 10:47:08 EST Ventricular Rate:  82 PR Interval:  128 QRS Duration: 73 QT Interval:  354 QTC Calculation: 414 R Axis:   44 Text Interpretation: Sinus rhythm Low voltage, precordial leads Abnormal R-wave progression, early transition No acute changes No significant change since last tracing Confirmed by Varney Biles [phone]) on 11/29/2021 5:59:18 PM  Radiology DG Chest 2 View  Result Date: 11/29/2021 CLINICAL DATA:  Chest pain EXAM: CHEST - 2 VIEW COMPARISON:  July 2021 FINDINGS: The heart size and  mediastinal contours are within normal limits. Both lungs are clear. No pleural effusion. No pneumothorax. The visualized skeletal structures are unremarkable. IMPRESSION: No active cardiopulmonary disease. Electronically Signed   By: Macy Mis M.D.   On: 11/29/2021 10:38   VAS [place] LOWER EXTREMITY VENOUS (DVT) (7a-7p)  Result Date: 11/29/2021  Lower Venous DVT Study Patient Name:  Paula Ray  Date of Exam:   11/29/2021 Medical Rec #: [id number]     Accession #:    [id number] Date of  Birth: 02/09/1976     Patient Gender: F Patient Age:   21 years Exam Location:  [facility] Procedure:      VAS [place] LOWER EXTREMITY VENOUS (DVT) Referring Phys: Laiden Milles --------------------------------------------------------------------------------  Indications: Pain.  Comparison Study: no prior Performing Technologist: [place] Ponder RVS  Examination Guidelines: A complete evaluation includes B-mode imaging, spectral Doppler, color Doppler, and power Doppler as needed of all accessible portions of each vessel. Bilateral testing is considered an integral part of a complete examination. Limited examinations for reoccurring indications may be performed as noted. The reflux portion of the exam is performed with the patient in reverse Trendelenburg.  +---------+---------------+---------+-----------+----------+-------------------+ RIGHT    CompressibilityPhasicitySpontaneityPropertiesThrombus Aging      +---------+---------------+---------+-----------+----------+-------------------+ CFV      Full           Yes      Yes                                      +---------+---------------+---------+-----------+----------+-------------------+ SFJ      Full                                                             +---------+---------------+---------+-----------+----------+-------------------+ FV Prox  Full                                                              +---------+---------------+---------+-----------+----------+-------------------+ FV Mid   Full                                                             +---------+---------------+---------+-----------+----------+-------------------+ FV DistalFull                                                             +---------+---------------+---------+-----------+----------+-------------------+ PFV      Full                                                             +---------+---------------+---------+-----------+----------+-------------------+ POP      Full           Yes      Yes                                      +---------+---------------+---------+-----------+----------+-------------------+ PTV      Full                                                             +---------+---------------+---------+-----------+----------+-------------------+  PERO                                                  Not well visualized +---------+---------------+---------+-----------+----------+-------------------+     Summary: RIGHT: - There is no evidence of deep vein thrombosis in the lower extremity.  - No cystic structure found in the popliteal fossa.  LEFT: - No evidence of common femoral vein obstruction.  *See table(s) above for measurements and observations.    Preliminary     Procedures Procedures   Medications Ordered in ED Medications - No data to display  ED Course  I have reviewed the triage vital signs and the nursing notes.  Pertinent labs & imaging results that were available during my care of the patient were reviewed by me and considered in my medical decision making (see chart for details).    MDM Rules/Calculators/A&P                           45 year old comes in with chief complaint of leg pain and back pain.  Patient had been waiting for over 9 hours by the time I saw her, and she wanted to focus on her chief complaint of back pain and leg pain.    Patient is having back pain, on exam there is no midline spine tenderness.  The pain is more so over the right side of her back and there is some tenderness over the calf.  Occasionally, there is calf pain without back pain -so although my suspicion is high for radiculopathy, we had added ultrasound DVT which is negative for acute process.  Results discussed with the patient, stable for discharge. We advised her to follow-up with her PCP in 1 week.  Final Clinical Impression(s) / ED Diagnoses Final diagnoses:  Acute right-sided low back pain, unspecified whether sciatica present  Right calf pain  Sciatica of right side  Elevated blood pressure reading    Rx / DC Orders ED Discharge Orders          Ordered    naproxen (NAPROSYN) 500 MG tablet  2 times daily        11/29/21 1918             Varney Biles, MD 11/29/21 1927

## 2021-11-29 NOTE — Discharge Instructions (Addendum)
The ultrasound of your leg is negative for blood clots.  We suspect right now that you likely have a pinched nerve that is causing your symptoms. Please take the medications prescribed and if the symptoms continue, follow-up with your primary care doctor to see if you need physical therapy.  Additionally, your blood pressure was noted to be slightly elevated.  Please read the instructions provided on how to check your blood pressure and record your blood pressure for the next 7 to 10 days before you see the PCP.

## 2021-11-29 NOTE — ED Provider Notes (Signed)
Emergency Medicine Provider Triage Evaluation Note  Paula Ray , a 46 y.o. female  was evaluated in triage.  Pt complains of gradual onset, intermittent, right lower back pain radiating into RLQ abdomen/down RLE for the past 1 week. No trauma. No history of sciatica. Also complaining of nausea; no urinary symptoms. No vomiting, diarrhea, constipation.  Pt feels like this has been bringing on her vertigo - typically takes meclizine however has not done so in the past few days. No facial droop, speech changes, unilateral weakness or numbness,   She also complains of intermittent chest pain underneath left breast x 1 week. No SOB. No smoking history. No family history of CAD.   Review of Systems  Positive: + nausea, room spinning dizziness, back pain, abd pain, nausea, chest pain Negative: - SOB, diaphoresis, urinary retention, urinary or bowel incontinence, vomiting, diarrhea, constipation  Physical Exam  BP (!) 152/108 (BP Location: Left Arm)   Pulse 77   Temp 98.5 F (36.9 C) (Oral)   Resp 16   Ht 5\' 4"  (1.626 m)   Wt 120.2 kg   SpO2 97%   BMI 45.49 kg/m  Gen:   Awake, no distress   Resp:  Normal effort  MSK:   Moves extremities without difficulty  Other:  No midline spinal TTP. + right buttock TTP. ROM intact to neck and back. Strength 5/5 to BUE and BLEs. Sensation intact throughout. No neuro deficits. No pronator drift.   Medical Decision Making  Medically screening exam initiated at 10:25 AM.  Appropriate orders placed.  Paula Ray was informed that the remainder of the evaluation will be completed by another provider, this initial triage assessment does not replace that evaluation, and the importance of remaining in the ED until their evaluation is complete.     Narda Amber, PA-C 11/29/21 1027    14/05/22, MD 12/01/21 1014

## 2021-11-29 NOTE — Progress Notes (Signed)
Lower extremity venous has been completed.   Preliminary results in CV Proc.   Paula Ray  11/29/2021 7:22 PM

## 2023-04-21 ENCOUNTER — Emergency Department (HOSPITAL_COMMUNITY): Payer: Managed Care, Other (non HMO)

## 2023-04-21 ENCOUNTER — Encounter (HOSPITAL_COMMUNITY): Payer: Self-pay

## 2023-04-21 ENCOUNTER — Emergency Department (HOSPITAL_COMMUNITY)
Admission: EM | Admit: 2023-04-21 | Discharge: 2023-04-22 | Disposition: A | Discharge status: Home / Self Care | Payer: Managed Care, Other (non HMO) | Attending: Emergency Medicine | Admitting: Emergency Medicine

## 2023-04-21 DIAGNOSIS — R0689 Other abnormalities of breathing: Secondary | ICD-10-CM | POA: Insufficient documentation

## 2023-04-21 DIAGNOSIS — R079 Chest pain, unspecified: Secondary | ICD-10-CM | POA: Insufficient documentation

## 2023-04-21 DIAGNOSIS — R519 Headache, unspecified: Secondary | ICD-10-CM | POA: Insufficient documentation

## 2023-04-21 DIAGNOSIS — R42 Dizziness and giddiness: Secondary | ICD-10-CM | POA: Diagnosis not present

## 2023-04-21 DIAGNOSIS — M791 Myalgia, unspecified site: Secondary | ICD-10-CM | POA: Insufficient documentation

## 2023-04-21 LAB — BASIC METABOLIC PANEL
Anion gap: 10 (ref 5–15)
BUN: 11 mg/dL (ref 6–20)
CO2: 23 mmol/L (ref 22–32)
Calcium: 8.9 mg/dL (ref 8.9–10.3)
Chloride: 104 mmol/L (ref 98–111)
Creatinine, Ser: 0.81 mg/dL (ref 0.44–1.00)
GFR, Estimated: >60 mL/min (ref >60)
Glucose, Bld: 98 mg/dL (ref 70–99)
Potassium: 3.9 mmol/L (ref 3.5–5.1)
Sodium: 137 mmol/L (ref 135–145)

## 2023-04-21 LAB — TROPONIN I (HIGH SENSITIVITY): Troponin I (High Sensitivity): <2 ng/L (ref <18)

## 2023-04-21 LAB — CBC
HCT: 35.9 % — ABNORMAL LOW (ref 36.0–46.0)
Hemoglobin: 12 g/dL (ref 12.0–15.0)
MCH: 30.4 pg (ref 26.0–34.0)
MCHC: 33.4 g/dL (ref 30.0–36.0)
MCV: 90.9 fL (ref 80.0–100.0)
Platelets: 364 10*3/uL (ref 150–400)
RBC: 3.95 MIL/uL (ref 3.87–5.11)
RDW: 12.1 % (ref 11.5–15.5)
WBC: 6.1 10*3/uL (ref 4.0–10.5)
nRBC: 0 % (ref 0.0–0.2)

## 2023-04-21 LAB — CK: Total CK: 75 U/L (ref 38–234)

## 2023-04-21 LAB — BRAIN NATRIURETIC PEPTIDE: B Natriuretic Peptide: 24.4 pg/mL (ref 0.0–100.0)

## 2023-04-21 LAB — I-STAT BETA HCG BLOOD, ED (MC, WL, AP ONLY): I-stat hCG, quantitative: <5 m[IU]/mL (ref <5)

## 2023-04-21 MED ORDER — MECLIZINE HCL 25 MG PO TABS
12.5000 mg | ORAL_TABLET | Freq: Once | ORAL | Status: AC
  Administered 2023-04-21: 12.5 mg via ORAL
  Filled 2023-04-21: qty 1

## 2023-04-21 MED ORDER — DIPHENHYDRAMINE HCL 50 MG/ML IJ SOLN
12.5000 mg | Freq: Once | INTRAMUSCULAR | Status: AC
  Administered 2023-04-21: 12.5 mg via INTRAVENOUS
  Filled 2023-04-21: qty 1

## 2023-04-21 MED ORDER — KETOROLAC TROMETHAMINE 15 MG/ML IJ SOLN
15.0000 mg | Freq: Once | INTRAMUSCULAR | Status: AC
  Administered 2023-04-21: 15 mg via INTRAVENOUS
  Filled 2023-04-21: qty 1

## 2023-04-21 MED ORDER — SODIUM CHLORIDE 0.9 % IV BOLUS
1000.0000 mL | Freq: Once | INTRAVENOUS | Status: AC
  Administered 2023-04-21: 1000 mL via INTRAVENOUS

## 2023-04-21 MED ORDER — SODIUM CHLORIDE 0.9 % IV SOLN: INTRAVENOUS | Status: DC

## 2023-04-21 MED ORDER — PROCHLORPERAZINE EDISYLATE 10 MG/2ML IJ SOLN
10.0000 mg | Freq: Once | INTRAMUSCULAR | Status: AC
  Administered 2023-04-21: 10 mg via INTRAVENOUS
  Filled 2023-04-21: qty 2

## 2023-04-21 NOTE — ED Provider Notes (Incomplete)
[facility] EMERGENCY DEPARTMENT AT [facility] Provider Note   CSN: [id number] Arrival date & time: 04/21/23  2059     History {Add pertinent medical, surgical, social history, OB history to HPI:1} Chief Complaint  Patient presents with   Dizziness   HPI Militza Ray is a 47 y.o. female with history of vertigo presenting for dizziness.  Dizziness has been intermittent for the last week.  Denies room spinning sensation and loss of balance, states she just feels "off".  Sitting down improves her symptoms.  Has also endorsed chest pain in the left side of her chest that is nonradiating and nonexertional.  It is also intermittent.  Denies fever, visual disturbance, recent trauma.  States her dizzy episodes have been more frequent which has been concerning to her ultimately prompted her to be evaluated today.  Also states she is having muscle pain in her arms and her legs.  Endorses a headache that comes and goes with the dizziness.   Dizziness      Home Medications Prior to Admission medications   Medication Sig Start Date End Date Taking? Authorizing Provider  acetaminophen (TYLENOL) 325 MG tablet Take 650 mg by mouth every 6 (six) hours as needed for mild pain or headache. Patient not taking: Reported on 11/29/2021    [provider]  calcium carbonate (TUMS EX) 750 MG chewable tablet Chew 2 tablets by mouth daily as needed for heartburn. Patient not taking: Reported on 11/29/2021    [provider]  cephALEXin (KEFLEX) 500 MG capsule Take 1 capsule (500 mg total) by mouth 3 (three) times daily. Patient not taking: Reported on 11/29/2021 11/07/20   Geoffery Lyons, MD  meclizine (ANTIVERT) 25 MG tablet Take 1 tablet (25 mg total) by mouth 3 (three) times daily as needed for dizziness. Patient not taking: Reported on 11/29/2021 12/06/20   Wynetta Fines, MD  naproxen (NAPROSYN) 500 MG tablet Take 1 tablet (500 mg total) by mouth 2 (two) times daily. 11/29/21    Derwood Kaplan, MD  omeprazole (PRILOSEC) 20 MG capsule Take 1 capsule (20 mg total) by mouth 2 (two) times daily before a meal. Patient not taking: Reported on 11/29/2021 11/07/20   Geoffery Lyons, MD  ondansetron (ZOFRAN) 4 MG tablet Take 1 tablet (4 mg total) by mouth every 6 (six) hours. Patient not taking: Reported on 11/29/2021 12/06/20   Wynetta Fines, MD  predniSONE (DELTASONE) 10 MG tablet Take 2 tablets (20 mg total) by mouth 2 (two) times daily with a meal. Patient not taking: Reported on 11/29/2021 11/07/20   Geoffery Lyons, MD      Allergies    Fexofenadine hcl and Zyrtec [cetirizine hcl]    Review of Systems   Review of Systems  Neurological:  Positive for dizziness.    Physical Exam   Vitals:   04/21/23 2146  BP: 109/71  Pulse: 70  Resp: 16  Temp: 98.5 F (36.9 C)  SpO2: 99%    CONSTITUTIONAL:  well-appearing, NAD NEURO:  GCS 15. Speech is goal oriented. No deficits appreciated to CN III-XII; symmetric eyebrow raise, no facial drooping, tongue midline. Patient has equal grip strength bilaterally with 5/5 strength against resistance in all major muscle groups bilaterally. Sensation to light touch intact. Patient moves extremities without ataxia. Normal finger-nose-finger. Patient ambulatory with steady gait.  EYES:  eyes equal and reactive ENT/NECK:  Supple, no stridor  CARDIO:  regular rate and rhythm, appears well-perfused  PULM:  No respiratory distress, CTAB GI/GU:  non-distended,  MSK/SPINE:  No gross deformities, no edema, moves all extremities  SKIN:  no rash, atraumatic   *Additional and/or pertinent findings included in MDM below    ED Results / Procedures / Treatments   Labs (all labs ordered are listed, but only abnormal results are displayed) Labs Reviewed - No data to display  EKG None  Radiology No results found.  Procedures Procedures  {Document cardiac monitor, telemetry assessment procedure when appropriate:1}  Medications  Ordered in ED Medications - No data to display  ED Course/ Medical Decision Making/ A&P   {   Click here for ABCD2, HEART and other calculatorsREFRESH Note before signing :1}                          Medical Decision Making  ***  {Document critical care time when appropriate:1} {Document review of labs and clinical decision tools ie heart score, Chads2Vasc2 etc:1}  {Document your independent review of radiology images, and any outside records:1} {Document your discussion with family members, caretakers, and with consultants:1} {Document social determinants of health affecting pt's care:1} {Document your decision making why or why not admission, treatments were needed:1} Final Clinical Impression(s) / ED Diagnoses Final diagnoses:  None    Rx / DC Orders ED Discharge Orders     None

## 2023-04-21 NOTE — ED Triage Notes (Signed)
Pt presents to ED for evaluation of light-headedness, chest pain, right arm numbness, nausea and vomiting for a few that began earlier this weak and gradually worsened.

## 2023-04-22 LAB — URINALYSIS, ROUTINE W REFLEX MICROSCOPIC
Bilirubin Urine: NEGATIVE
Glucose, UA: NEGATIVE mg/dL
Ketones, ur: NEGATIVE mg/dL
Leukocytes,Ua: NEGATIVE
Nitrite: NEGATIVE
Protein, ur: NEGATIVE mg/dL
RBC / HPF: >50 RBC/hpf (ref 0–5)
Specific Gravity, Urine: 1.009 (ref 1.005–1.030)
pH: 6 (ref 5.0–8.0)

## 2023-04-22 LAB — TROPONIN I (HIGH SENSITIVITY): Troponin I (High Sensitivity): 3 ng/L (ref <18)

## 2023-04-22 NOTE — Discharge Instructions (Addendum)
Evaluation for your dizziness and chest pain was overall reassuring.  Suspect that the dizziness is of a peripheral source.  You can continue to take your meclizine as prescribed.  In terms of your chest pain EKG, serial cardiac enzymes and chest x-ray were within normal limits.  Recommend you follow-up with your PCP for your atypical chest pain and your dizziness.  If you have worsening chest pain, shortness of breath, calf tenderness, persistent dizziness, weakness or numbness in your extremities, facial droop, slurred speech or any other concerning symptom please return emerged part for further evaluation.
